# Patient Record
Sex: Male | Born: 1960 | Race: White | Hispanic: No | Marital: Single | Smoking: Never smoker
Health system: Southern US, Community
[De-identification: ages and names within clinical notes are randomized; demographics above are authoritative.]

## PROBLEM LIST (undated history)

## (undated) DIAGNOSIS — I83003 Varicose veins of unspecified lower extremity with ulcer of ankle: Secondary | ICD-10-CM

## (undated) DIAGNOSIS — E119 Type 2 diabetes mellitus without complications: Secondary | ICD-10-CM

## (undated) DIAGNOSIS — I878 Other specified disorders of veins: Secondary | ICD-10-CM

## (undated) DIAGNOSIS — L97309 Non-pressure chronic ulcer of unspecified ankle with unspecified severity: Secondary | ICD-10-CM

## (undated) HISTORY — PX: CHOLECYSTECTOMY: SHX55

---

## 2013-12-11 ENCOUNTER — Encounter (HOSPITAL_COMMUNITY): Payer: Self-pay | Admitting: Emergency Medicine

## 2013-12-11 ENCOUNTER — Emergency Department (HOSPITAL_COMMUNITY): Payer: Medicaid - Out of State

## 2013-12-11 ENCOUNTER — Emergency Department (HOSPITAL_COMMUNITY)
Admission: EM | Admit: 2013-12-11 | Discharge: 2013-12-12 | Disposition: A | Discharge status: Home / Self Care | Payer: Medicaid - Out of State | Attending: Emergency Medicine | Admitting: Emergency Medicine

## 2013-12-11 DIAGNOSIS — L97929 Non-pressure chronic ulcer of unspecified part of left lower leg with unspecified severity: Secondary | ICD-10-CM

## 2013-12-11 DIAGNOSIS — I83029 Varicose veins of left lower extremity with ulcer of unspecified site: Secondary | ICD-10-CM

## 2013-12-11 DIAGNOSIS — R110 Nausea: Secondary | ICD-10-CM | POA: Insufficient documentation

## 2013-12-11 DIAGNOSIS — E119 Type 2 diabetes mellitus without complications: Secondary | ICD-10-CM | POA: Insufficient documentation

## 2013-12-11 DIAGNOSIS — Z792 Long term (current) use of antibiotics: Secondary | ICD-10-CM | POA: Insufficient documentation

## 2013-12-11 DIAGNOSIS — L97909 Non-pressure chronic ulcer of unspecified part of unspecified lower leg with unspecified severity: Principal | ICD-10-CM | POA: Insufficient documentation

## 2013-12-11 DIAGNOSIS — R21 Rash and other nonspecific skin eruption: Secondary | ICD-10-CM | POA: Insufficient documentation

## 2013-12-11 DIAGNOSIS — I83009 Varicose veins of unspecified lower extremity with ulcer of unspecified site: Secondary | ICD-10-CM | POA: Insufficient documentation

## 2013-12-11 HISTORY — DX: Non-pressure chronic ulcer of unspecified ankle with unspecified severity: L97.309

## 2013-12-11 HISTORY — DX: Other specified disorders of veins: I87.8

## 2013-12-11 HISTORY — DX: Varicose veins of unspecified lower extremity with ulcer of ankle: I83.003

## 2013-12-11 HISTORY — DX: Type 2 diabetes mellitus without complications: E11.9

## 2013-12-11 LAB — COMPREHENSIVE METABOLIC PANEL
ALBUMIN: 3.6 g/dL (ref 3.5–5.2)
ALK PHOS: 66 U/L (ref 39–117)
ALT: 23 U/L (ref 0–53)
AST: 28 U/L (ref 0–37)
BILIRUBIN TOTAL: 0.5 mg/dL (ref 0.3–1.2)
BUN: 22 mg/dL (ref 6–23)
CHLORIDE: 105 meq/L (ref 96–112)
CO2: 24 meq/L (ref 19–32)
Calcium: 8.6 mg/dL (ref 8.4–10.5)
Creatinine, Ser: 1.2 mg/dL (ref 0.50–1.35)
GFR calc Af Amer: 79 mL/min — ABNORMAL LOW (ref >90)
GFR, EST NON AFRICAN AMERICAN: 68 mL/min — AB (ref >90)
GLUCOSE: 131 mg/dL — AB (ref 70–99)
POTASSIUM: 3.7 meq/L (ref 3.7–5.3)
Sodium: 145 mEq/L (ref 137–147)
Total Protein: 7.6 g/dL (ref 6.0–8.3)

## 2013-12-11 LAB — CBC WITH DIFFERENTIAL/PLATELET
Basophils Absolute: 0 10*3/uL (ref 0.0–0.1)
Basophils Relative: 1 % (ref 0–1)
Eosinophils Absolute: 0.2 10*3/uL (ref 0.0–0.7)
Eosinophils Relative: 3 % (ref 0–5)
HEMATOCRIT: 38.8 % — AB (ref 39.0–52.0)
HEMOGLOBIN: 13.1 g/dL (ref 13.0–17.0)
LYMPHS ABS: 1.4 10*3/uL (ref 0.7–4.0)
LYMPHS PCT: 22 % (ref 12–46)
MCH: 30.3 pg (ref 26.0–34.0)
MCHC: 33.8 g/dL (ref 30.0–36.0)
MCV: 89.8 fL (ref 78.0–100.0)
MONO ABS: 0.7 10*3/uL (ref 0.1–1.0)
MONOS PCT: 11 % (ref 3–12)
NEUTROS ABS: 3.9 10*3/uL (ref 1.7–7.7)
NEUTROS PCT: 63 % (ref 43–77)
Platelets: 213 10*3/uL (ref 150–400)
RBC: 4.32 MIL/uL (ref 4.22–5.81)
RDW: 14 % (ref 11.5–15.5)
WBC: 6.2 10*3/uL (ref 4.0–10.5)

## 2013-12-11 LAB — I-STAT CG4 LACTIC ACID, ED: LACTIC ACID, VENOUS: 1.1 mmol/L (ref 0.5–2.2)

## 2013-12-11 NOTE — ED Notes (Signed)
Pt reports venous stasis ulcers to left ankle. Pt reports having these ulcers for six years. Pt presents with three ulcers, draining, red and painful. Pt denies fevers, chills, v/d at home. Reports some nausea.

## 2013-12-11 NOTE — ED Notes (Signed)
Pt has three ulcers on his left lower leg that he has been dealing with for 6 years.  Pt states they had been much better when he was being [place], but since moving to [place] they have gotten worse.

## 2013-12-12 LAB — SEDIMENTATION RATE: Sed Rate: 33 mm/hr — ABNORMAL HIGH (ref 0–16)

## 2013-12-12 MED ORDER — FUROSEMIDE 10 MG/ML IJ SOLN
40.0000 mg | Freq: Once | INTRAMUSCULAR | Status: AC
  Administered 2013-12-12: 40 mg via INTRAVENOUS
  Filled 2013-12-12: qty 4

## 2013-12-12 MED ORDER — SULFAMETHOXAZOLE-TRIMETHOPRIM 800-160 MG PO TABS: 1.0000 | ORAL_TABLET | Freq: Two times a day (BID) | ORAL | Status: DC

## 2013-12-12 MED ORDER — HYDROMORPHONE HCL PF 1 MG/ML IJ SOLN
1.0000 mg | Freq: Once | INTRAMUSCULAR | Status: AC
  Administered 2013-12-12: 1 mg via INTRAVENOUS
  Filled 2013-12-12: qty 1

## 2013-12-12 MED ORDER — CEPHALEXIN 500 MG PO CAPS: 500.0000 mg | ORAL_CAPSULE | Freq: Three times a day (TID) | ORAL | Status: DC

## 2013-12-12 MED ORDER — HYDROCODONE-ACETAMINOPHEN 5-325 MG PO TABS: 2.0000 | ORAL_TABLET | ORAL | Status: DC | PRN

## 2013-12-12 MED ORDER — KETOROLAC TROMETHAMINE 15 MG/ML IJ SOLN: 15.0000 mg | Freq: Once | INTRAMUSCULAR | Status: DC

## 2013-12-12 NOTE — ED Provider Notes (Signed)
CSN: [id number]     Arrival date & time 12/11/13  2113 History   First MD Initiated Contact with Patient 12/11/13 2331     Chief Complaint  Patient presents with  . Venous Stasis Ulcer     (Consider location/radiation/quality/duration/timing/severity/associated sxs/prior Treatment) HPI Comments: 53 year old male with history of venous stasis, venous ulcer, diabetes presents with worsening pain and enlargement of left lower leg venous ulcer. Patient has had for 6 years however worse recently. Patient new to the area recently. Patient currently working on primary Dr for. Pain with palpation walking. Notion surgeries or blood clot history. No fevers or chills. Clear drainage and mild redness at the site. however mild nausea. No current antibiotics.   The history is provided by the patient.    Past Medical History  Diagnosis Date  . Venous stasis   . Venous stasis ulcer of ankle   . Diabetes mellitus without complication    Past Surgical History  Procedure Laterality Date  . Cholecystectomy     History reviewed. No pertinent family history. History  Substance Use Topics  . Smoking status: Never Smoker   . Smokeless tobacco: Never Used  . Alcohol Use: Yes     Comment: occassionally     Review of Systems  Constitutional: Negative for fever and chills.  HENT: Negative for congestion.   Respiratory: Negative for shortness of breath.   Cardiovascular: Negative for chest pain.  Gastrointestinal: Negative for vomiting and abdominal pain.  Genitourinary: Negative for flank pain.  Musculoskeletal: Negative for back pain, neck pain and neck stiffness.  Skin: Positive for rash and wound.  Neurological: Negative for light-headedness and headaches.      Allergies  Review of patient's allergies indicates no known allergies.  Home Medications   Prior to Admission medications   Medication Sig Start Date End Date Taking? Authorizing Provider  acetaminophen (TYLENOL) 325 MG tablet  Take 975 mg by mouth 2 (two) times daily as needed (pain).   Yes Historical Provider, MD  ibuprofen (ADVIL,MOTRIN) 200 MG tablet Take 1,200-1,400 mg by mouth 3 (three) times daily as needed (pain).   Yes Historical Provider, MD  cephALEXin (KEFLEX) 500 MG capsule Take 1 capsule (500 mg total) by mouth 3 (three) times daily. 12/12/13   Enid Skeens, MD  HYDROcodone-acetaminophen (NORCO) 5-325 MG per tablet Take 2 tablets by mouth every 4 (four) hours as needed. 12/12/13   Enid Skeens, MD  sulfamethoxazole-trimethoprim (SEPTRA DS) 800-160 MG per tablet Take 1 tablet by mouth 2 (two) times daily. 12/12/13   Enid Skeens, MD   BP 132/81  Pulse 76  Temp(Src) 98.9 F (37.2 C) (Oral)  Resp 18  Ht 5\' 8"  (1.727 m)  Wt 218 lb (98.884 kg)  BMI 33.15 kg/m2  SpO2 98% Physical Exam  Nursing note and vitals reviewed. Constitutional: He is oriented to person, place, and time. He appears well-developed and well-nourished.  HENT:  Head: Normocephalic and atraumatic.  Eyes: Conjunctivae are normal. Right eye exhibits no discharge. Left eye exhibits no discharge.  Neck: Normal range of motion. Neck supple. No tracheal deviation present.  Cardiovascular: Normal rate and regular rhythm.   Pulmonary/Chest: Effort normal and breath sounds normal.  Abdominal: Soft. He exhibits no distension. There is no tenderness. There is no guarding.  Musculoskeletal: He exhibits tenderness. He exhibits no edema.  Neurological: He is alert and oriented to person, place, and time.  Skin: Skin is warm. No rash noted.  Mass patient has large approximately 5  cm diameter ulceration left lateral lower leg and smaller ulcer distal with no active discharge, mild pain to palpation. No significant shunting erythema or streaking. No bone visualized or palpated. Neurovascular intact distal. No crepitus or bowl eye.  Psychiatric: He has a normal mood and affect.    ED Course  Procedures (including critical care time) Labs  Review Labs Reviewed  CBC WITH DIFFERENTIAL - Abnormal; Notable for the following:    HCT 38.8 (*)    All other components within normal limits  COMPREHENSIVE METABOLIC PANEL - Abnormal; Notable for the following:    Glucose, Bld 131 (*)    GFR calc non Af Amer 68 (*)    GFR calc Af Amer 79 (*)    All other components within normal limits  SEDIMENTATION RATE  I-STAT CG4 LACTIC ACID, ED    Imaging Review Dg Ankle Complete Left  12/11/2013   CLINICAL DATA:  Ulcers on the side of the leg.  EXAM: LEFT ANKLE COMPLETE - 3+ VIEW  COMPARISON:  No priors.  FINDINGS: Large soft tissue defect in the lateral aspect of the leg overlying the distal third of the fibular diaphysis. No underlying aggressive bony lesion. No acute displaced fracture, subluxation or dislocation. Mild degenerative changes of osteoarthritis are noted in the hindfoot and at the tibiotalar joint.  IMPRESSION: 1. No acute bony abnormality. 2. Large soft tissue defect in the lateral aspect of the left leg.   Electronically Signed   By: Trudie Reedaniel  Entrikin M.D.   On: 12/11/2013 23:50     EKG Interpretation None      MDM   Final diagnoses:  Venous ulcer of left leg  Nausea   Concern for worsening venous ulceration with possible secondary infection. Blood work and vitals unremarkable. Sedimentation rate sent help with outpatient followup as patient likely will need MRI outpatient. Pain meds given in ED and patient well-appearing on exam. X-ray no acute findings. Plan for oral pain meds and oral antibiotics. Patient has a followup appointment next week.  Results and differential diagnosis were discussed with the patient/parent/guardian. Close follow up outpatient was discussed, comfortable with the plan.   Filed Vitals:   12/11/13 2122 12/12/13 0109 12/12/13 0111 12/12/13 0115  BP: 117/77 131/83  132/81  Pulse: 93  80 76  Temp: 98.9 F (37.2 C)     TempSrc: Oral     Resp: 18     Height: 5\' 8"  (1.727 m)     Weight: 218  lb (98.884 kg)     SpO2: 96%  98% 98%         Enid SkeensJoshua M Eland Lamantia, MD 12/12/13 0150

## 2013-12-12 NOTE — Discharge Instructions (Signed)
If you were given medicines take as directed.  If you are on coumadin or contraceptives realize their levels and effectiveness is altered by many different medicines.  If you have any reaction (rash, tongues swelling, other) to the medicines stop taking and see a physician.   Please follow up as directed and return to the ER or see a physician for new or worsening symptoms fever, spreading redness, pus draining.  Thank you. Filed Vitals:   12/11/13 2122 12/12/13 0109 12/12/13 0111 12/12/13 0115  BP: 117/77 131/83  132/81  Pulse: 93  80 76  Temp: 98.9 F (37.2 C)     TempSrc: Oral     Resp: 18     Height: 5\' 8"  (1.727 m)     Weight: 218 lb (98.884 kg)     SpO2: 96%  98% 98%

## 2013-12-16 DIAGNOSIS — I872 Venous insufficiency (chronic) (peripheral): Secondary | ICD-10-CM | POA: Insufficient documentation

## 2013-12-16 DIAGNOSIS — Z79899 Other long term (current) drug therapy: Secondary | ICD-10-CM | POA: Insufficient documentation

## 2013-12-16 DIAGNOSIS — Z792 Long term (current) use of antibiotics: Secondary | ICD-10-CM | POA: Insufficient documentation

## 2013-12-16 DIAGNOSIS — E119 Type 2 diabetes mellitus without complications: Secondary | ICD-10-CM | POA: Insufficient documentation

## 2013-12-17 ENCOUNTER — Encounter (HOSPITAL_COMMUNITY): Payer: Self-pay | Admitting: Emergency Medicine

## 2013-12-17 ENCOUNTER — Emergency Department (HOSPITAL_COMMUNITY)
Admission: EM | Admit: 2013-12-17 | Discharge: 2013-12-17 | Disposition: A | Discharge status: Home / Self Care | Payer: Medicaid - Out of State | Attending: Emergency Medicine | Admitting: Emergency Medicine

## 2013-12-17 DIAGNOSIS — L97909 Non-pressure chronic ulcer of unspecified part of unspecified lower leg with unspecified severity: Secondary | ICD-10-CM

## 2013-12-17 DIAGNOSIS — I83009 Varicose veins of unspecified lower extremity with ulcer of unspecified site: Secondary | ICD-10-CM

## 2013-12-17 LAB — BASIC METABOLIC PANEL
BUN: 23 mg/dL (ref 6–23)
CALCIUM: 9.4 mg/dL (ref 8.4–10.5)
CO2: 23 mEq/L (ref 19–32)
Chloride: 102 mEq/L (ref 96–112)
Creatinine, Ser: 1.61 mg/dL — ABNORMAL HIGH (ref 0.50–1.35)
GFR calc Af Amer: 55 mL/min — ABNORMAL LOW (ref >90)
GFR calc non Af Amer: 48 mL/min — ABNORMAL LOW (ref >90)
GLUCOSE: 95 mg/dL (ref 70–99)
Potassium: 4.3 mEq/L (ref 3.7–5.3)
SODIUM: 140 meq/L (ref 137–147)

## 2013-12-17 LAB — CBC
HCT: 41.7 % (ref 39.0–52.0)
HEMOGLOBIN: 14.1 g/dL (ref 13.0–17.0)
MCH: 30.3 pg (ref 26.0–34.0)
MCHC: 33.8 g/dL (ref 30.0–36.0)
MCV: 89.7 fL (ref 78.0–100.0)
Platelets: 279 10*3/uL (ref 150–400)
RBC: 4.65 MIL/uL (ref 4.22–5.81)
RDW: 13.7 % (ref 11.5–15.5)
WBC: 10.5 10*3/uL (ref 4.0–10.5)

## 2013-12-17 MED ORDER — OXYCODONE-ACETAMINOPHEN 5-325 MG PO TABS
1.0000 | ORAL_TABLET | Freq: Once | ORAL | Status: DC
  Filled 2013-12-17: qty 1

## 2013-12-17 MED ORDER — HYDROMORPHONE HCL PF 1 MG/ML IJ SOLN
2.0000 mg | Freq: Once | INTRAMUSCULAR | Status: AC
  Administered 2013-12-17: 2 mg via INTRAMUSCULAR
  Filled 2013-12-17: qty 2

## 2013-12-17 MED ORDER — OXYCODONE-ACETAMINOPHEN 5-325 MG PO TABS: 1.0000 | ORAL_TABLET | Freq: Four times a day (QID) | ORAL | Status: DC | PRN

## 2013-12-17 MED ORDER — OXYCODONE-ACETAMINOPHEN 5-325 MG PO TABS
1.0000 | ORAL_TABLET | Freq: Once | ORAL | Status: AC
  Administered 2013-12-17: 1 via ORAL
  Filled 2013-12-17: qty 1

## 2013-12-17 NOTE — ED Notes (Signed)
Pt here for evaluation of pain due to swelling of LLE, three venous stasis ulcers present. No active drainage.

## 2013-12-17 NOTE — ED Provider Notes (Signed)
CSN: [id number]     Arrival date & time 12/16/13  2326 History   First MD Initiated Contact with Patient 12/17/13 [id number]     Chief Complaint  Patient presents with  . Venous Insufficiency     (Consider location/radiation/quality/duration/timing/severity/associated sxs/prior Treatment) HPI This is a 53 year old male with a history of chronic venous insufficiency in his left lower extremity. He recently moved here from [place] and has not established with a primary care physician. Over the past 2 months he has developed venous stasis ulcers of his left lower leg. He is here with complaints of severe pain associated with these ulcers. He was seen on the 22nd of this month and placed on Keflex and Bactrim for treatment of presumed infection. He was given 10 hydrocodone tablets which she is now out of. He has an appointment with a new PCP a week from tomorrow. He is requesting pain control in the meantime. He denies fevers. His wounds are not draining. Pain is worse with ambulation.  Past Medical History  Diagnosis Date  . Venous stasis   . Venous stasis ulcer of ankle   . Diabetes mellitus without complication    Past Surgical History  Procedure Laterality Date  . Cholecystectomy     No family history on file. History  Substance Use Topics  . Smoking status: Never Smoker   . Smokeless tobacco: Never Used  . Alcohol Use: Yes     Comment: occassionally     Review of Systems  All other systems reviewed and are negative.  Allergies  Review of patient's allergies indicates no known allergies.  Home Medications   Prior to Admission medications   Medication Sig Start Date End Date Taking? Authorizing Provider  acetaminophen (TYLENOL) 325 MG tablet Take 975 mg by mouth 2 (two) times daily as needed (pain).   Yes Historical Provider, MD  cephALEXin (KEFLEX) 500 MG capsule Take 1 capsule (500 mg total) by mouth 3 (three) times daily. 12/12/13  Yes Enid SkeensJoshua M Zavitz, MD  ibuprofen  (ADVIL,MOTRIN) 200 MG tablet Take 1,200-1,400 mg by mouth 3 (three) times daily as needed (pain).   Yes Historical Provider, MD  sulfamethoxazole-trimethoprim (SEPTRA DS) 800-160 MG per tablet Take 1 tablet by mouth 2 (two) times daily. 12/12/13  Yes Enid SkeensJoshua M Zavitz, MD   BP 117/88  Pulse 79  Temp(Src) 98.7 F (37.1 C) (Oral)  SpO2 96%  Physical Exam  General: Well-developed, well-nourished male in no acute distress; appearance consistent with age of record HENT: normocephalic; atraumatic Eyes: pupils equal, round and reactive to light; extraocular muscles intact Neck: supple Heart: regular rate and rhythm Lungs: clear to auscultation bilaterally Abdomen: soft; nondistended Extremities: No deformity; full range of motion; pulses normal; varicose veins of left lower leg with stasis ulcers as shown:     Skin (continued): No surrounding warmth or drainage noted about the ulcers Neurologic: Awake, alert and oriented; motor function intact in all extremities and symmetric; no facial droop Skin: Warm and dry Psychiatric: Normal mood and affect    ED Course  Procedures (including critical care time)  MDM   Nursing notes and vitals signs, including pulse oximetry, reviewed.  Summary of this visit's results, reviewed by myself:  Labs:  Results for orders placed during the hospital encounter of 12/17/13 (from the past 24 hour(s))  CBC     Status: None   Collection Time    12/17/13 12:18 AM      Result Value Ref Range   WBC 10.5  4.0 - 10.5 K/uL   RBC 4.65  4.22 - 5.81 MIL/uL   Hemoglobin 14.1  13.0 - 17.0 g/dL   HCT 16.6  06.0 - 04.5 %   MCV 89.7  78.0 - 100.0 fL   MCH 30.3  26.0 - 34.0 pg   MCHC 33.8  30.0 - 36.0 g/dL   RDW 99.7  74.1 - 42.3 %   Platelets 279  150 - 400 K/uL  BASIC METABOLIC PANEL     Status: Abnormal   Collection Time    12/17/13 12:18 AM      Result Value Ref Range   Sodium 140  137 - 147 mEq/L   Potassium 4.3  3.7 - 5.3 mEq/L   Chloride 102  96 -  112 mEq/L   CO2 23  19 - 32 mEq/L   Glucose, Bld 95  70 - 99 mg/dL   BUN 23  6 - 23 mg/dL   Creatinine, Ser 9.53 (*) 0.50 - 1.35 mg/dL   Calcium 9.4  8.4 - 20.2 mg/dL   GFR calc non Af Amer 48 (*) >90 mL/min   GFR calc Af Amer 55 (*) >90 mL/min        Hanley Seamen, MD 12/17/13 0345

## 2013-12-17 NOTE — Discharge Instructions (Signed)
Stasis Ulcer Stasis ulcers occur in the legs when the circulation is damaged. An ulcer may look like a small hole in the skin.  CAUSES Stasis ulcers occur because your veins do not work properly. Veins have valves that help the blood return to the heart. If these valves do not work right, blood flows backwards and backs up into the veins near the skin. This condition causes the veins to become larger because of increased pressure and may lead to a stasis ulcer. SYMPTOMS   Shallow (superficial) sore on the leg.  Clear drainage or weeping from the sore.  Leg pain or a feeling of heaviness. This may be worse at the end of the day.  Leg swelling.  Skin color changes. DIAGNOSIS  Your caregiver will make a diagnosis by examining your leg. Your caregiver may order tests such as an ultrasound or other studies to evaluate the blood flow of the leg. HOME CARE INSTRUCTIONS   Do not stand or sit in one position for long periods of time. Do not sit with your legs crossed. Rest with your legs raised during the day. If possible, it is best if you can elevate your legs above your heart for 30 minutes, 3 to 4 times a day.  Wear elastic stockings or support hose. Do not wear other tight encircling garments around legs, pelvis, or waist. This causes increased pressure in your veins. If your caregiver has applied compressive medicated wraps, use them as instructed.  Walk as much as possible to increase blood flow. If you are taking long rides in a car or plane, take a break to walk around every 2 hours. If not already on aspirin, take a baby aspirin before long trips unless you have medical reasons that prohibit this.  Raise the foot of your bed at night with 2-inch blocks if approved by your caregiver. This may not be desirable if you have heart failure or breathing problems.  If you get a cut in the skin over the vein and the vein bleeds, lie down with your leg raised and gently clean the area with a clean  cloth. Apply pressure on the cut until the bleeding stops. Then place a dressing on the cut. See your caregiver if it continues to bleed or needs stitches. Also, see your caregiver if you develop an infection.Signs of an infection include a fever, redness, increased pain, and drainage of pus.  If your caregiver has given you a follow-up appointment, it is very important to keep that appointment. Not keeping the appointment could result in a chronic or permanent injury, pain, and disability. If there is any problem keeping the appointment, call your caregiver for assistance. SEEK IMMEDIATE MEDICAL CARE IF:  You have pain, redness, tenderness, pus, or hard swelling in your leg over a vein or near the ulcer.  You develop an unexplained fever.  You develop chest pain or shortness of breath.  Document Released: 04/04/2001 Document Revised: 10/02/2011 Document Reviewed: 10/30/2010 [facility] Patient Information 2014 [place], [place].

## 2013-12-17 NOTE — ED Notes (Signed)
Pt reports hx of venous stasis of L leg. Pt has 3 ulcers present to L lower leg. Reports they were getting better but over past 2 months they have gotten larger again. Pt has not established a doctor in [place] yet. PMS intact distally.

## 2013-12-25 ENCOUNTER — Ambulatory Visit: Payer: Medicaid Other | Attending: Internal Medicine | Admitting: Internal Medicine

## 2013-12-25 ENCOUNTER — Encounter: Payer: Self-pay | Admitting: Internal Medicine

## 2013-12-25 VITALS — BP 130/74 | HR 78 | Temp 98.8°F | Resp 16 | Wt 217.2 lb

## 2013-12-25 DIAGNOSIS — D6859 Other primary thrombophilia: Secondary | ICD-10-CM | POA: Diagnosis not present

## 2013-12-25 DIAGNOSIS — E119 Type 2 diabetes mellitus without complications: Secondary | ICD-10-CM | POA: Insufficient documentation

## 2013-12-25 DIAGNOSIS — Z86718 Personal history of other venous thrombosis and embolism: Secondary | ICD-10-CM | POA: Insufficient documentation

## 2013-12-25 DIAGNOSIS — I749 Embolism and thrombosis of unspecified artery: Secondary | ICD-10-CM

## 2013-12-25 DIAGNOSIS — L97909 Non-pressure chronic ulcer of unspecified part of unspecified lower leg with unspecified severity: Secondary | ICD-10-CM

## 2013-12-25 DIAGNOSIS — I872 Venous insufficiency (chronic) (peripheral): Secondary | ICD-10-CM | POA: Insufficient documentation

## 2013-12-25 DIAGNOSIS — I83009 Varicose veins of unspecified lower extremity with ulcer of unspecified site: Secondary | ICD-10-CM

## 2013-12-25 DIAGNOSIS — L97809 Non-pressure chronic ulcer of other part of unspecified lower leg with unspecified severity: Secondary | ICD-10-CM | POA: Diagnosis not present

## 2013-12-25 DIAGNOSIS — I8290 Acute embolism and thrombosis of unspecified vein: Secondary | ICD-10-CM | POA: Insufficient documentation

## 2013-12-25 DIAGNOSIS — L97929 Non-pressure chronic ulcer of unspecified part of left lower leg with unspecified severity: Secondary | ICD-10-CM

## 2013-12-25 DIAGNOSIS — Z7901 Long term (current) use of anticoagulants: Secondary | ICD-10-CM | POA: Diagnosis not present

## 2013-12-25 DIAGNOSIS — I8310 Varicose veins of unspecified lower extremity with inflammation: Secondary | ICD-10-CM

## 2013-12-25 DIAGNOSIS — I83029 Varicose veins of left lower extremity with ulcer of unspecified site: Secondary | ICD-10-CM

## 2013-12-25 LAB — POCT GLYCOSYLATED HEMOGLOBIN (HGB A1C): Hemoglobin A1C: 5.7

## 2013-12-25 LAB — GLUCOSE, POCT (MANUAL RESULT ENTRY): POC Glucose: 142 mg/dl — AB (ref 70–99)

## 2013-12-25 LAB — POCT INR: INR: 1.1

## 2013-12-25 MED ORDER — ACETAMINOPHEN-CODEINE #3 300-30 MG PO TABS: 1.0000 | ORAL_TABLET | ORAL | Status: DC | PRN

## 2013-12-25 MED ORDER — WARFARIN SODIUM 5 MG PO TABS: 5.0000 mg | ORAL_TABLET | Freq: Every day | ORAL | Status: AC

## 2013-12-25 MED ORDER — METFORMIN HCL 500 MG PO TABS: 500.0000 mg | ORAL_TABLET | Freq: Two times a day (BID) | ORAL | Status: AC

## 2013-12-25 MED ORDER — SULFAMETHOXAZOLE-TRIMETHOPRIM 800-160 MG PO TABS: 1.0000 | ORAL_TABLET | Freq: Two times a day (BID) | ORAL | Status: AC

## 2013-12-25 NOTE — Progress Notes (Signed)
Patient ID: Jesse Mccoy, male   DOB: 07-15-1961, 53 y.o.   MRN: [id number]   Jesse Servelbert Adamski, is a 53 y.o. male  [id number]  [id number]  DOB - 07-15-1961  CC:  Chief Complaint  Patient presents with  . Establish Care       HPI: Jesse Servelbert Filion is a 53 y.o. male here today to establish medical care. Patient is known to have chronic venous stasis, protein S deficiency, diabetes mellitus, and venous ulcer on the left leg. He recently relocated from [place] to [place], he has been out of medication for his diabetes for over 2 weeks, and his Coumadin for the same period of time. 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 His INR today is 1.1. There are 3 ulcers on the left leg, one temp is enlarging in size and weeping. He denies fever. He was recently seen at urgent care for the same and was prescribed antibiotics but has not been established with wound care. Patient has No headache, No chest pain, No abdominal pain - No Nausea, No new weakness tingling or numbness, No Cough - SOB.  No Known Allergies Past Medical History  Diagnosis Date  . Venous stasis   . Venous stasis ulcer of ankle   . Diabetes mellitus without complication    No current outpatient prescriptions on file prior to visit.   No current facility-administered medications on file prior to visit.   History reviewed. No pertinent family history. History   Social History  . Marital Status: Single    Spouse Name: N/A    Number of Children: N/A  . Years of Education: N/A   Occupational History  . Not on file.   Social History Main Topics  . Smoking status: Never Smoker   . Smokeless tobacco: Never Used  . Alcohol Use: Yes     Comment: occassionally   . Drug Use: No  . Sexual Activity: Not on file   Other Topics Concern  . Not on file   Social History Narrative  . No narrative on file    Review of  Systems: Constitutional: Negative for fever, chills, diaphoresis, activity change, appetite change and fatigue. HENT: Negative for ear pain, nosebleeds, congestion, facial swelling, rhinorrhea, neck pain, neck stiffness and ear discharge.  Eyes: Negative for pain, discharge, redness, itching and visual disturbance. Respiratory: Negative for cough, choking, chest tightness, shortness of breath, wheezing and stridor.  Cardiovascular: Negative for chest pain, palpitations and leg swelling. Gastrointestinal: Negative for abdominal distention. Genitourinary: Negative for dysuria, urgency, frequency, hematuria, flank pain, decreased urine volume, difficulty urinating and dyspareunia.  Musculoskeletal: Negative for back pain, joint swelling, arthralgia and gait problem. Neurological: Negative for dizziness, tremors, seizures, syncope, facial asymmetry, speech difficulty, weakness, light-headedness, numbness and headaches.  Hematological: Negative for adenopathy. Does not bruise/bleed easily. Psychiatric/Behavioral: Negative for hallucinations, behavioral problems, confusion, dysphoric mood, decreased concentration and agitation.    Objective:   Filed Vitals:   12/25/13 1411  BP: 130/74  Pulse: 78  Temp: 98.8 F (37.1 C)  Resp: 16    Physical Exam: Constitutional: Patient appears well-developed and well-nourished. No distress. Morbidly obese HENT: Normocephalic, atraumatic, External right and left ear normal. Oropharynx is clear and moist.  Eyes: Conjunctivae and EOM are normal. PERRLA, no scleral icterus. Neck: Normal Mccoy. Neck supple. No JVD. No tracheal deviation. No thyromegaly. CVS: RRR, S1/S2 +, no murmurs, no gallops, no carotid bruit.  Pulmonary: Effort  and breath sounds normal, no stridor, rhonchi, wheezes, rales.  Abdominal: Soft. BS +, no distension, tenderness, rebound or guarding.  Musculoskeletal: large about 5 cm diameter ulceration left lateral lower leg and smaller ulcer  distal with no active discharge, mild pain to palpation. No significant shunting erythema or streaking. No bone visualized or palpated. Neurovascular intact distal. No crepitus. Lymphadenopathy: No lymphadenopathy noted, cervical, inguinal or axillary Neuro: Alert. Normal reflexes, muscle tone coordination. No cranial nerve deficit. Skin: Skin is warm and dry. No rash noted. Not diaphoretic. No erythema. No pallor. Psychiatric: Normal mood and affect. Behavior, judgment, thought content normal.  Lab Results  Component Value Date   WBC 10.5 12/17/2013   HGB 14.1 12/17/2013   HCT 41.7 12/17/2013   MCV 89.7 12/17/2013   PLT 279 12/17/2013   Lab Results  Component Value Date   CREATININE 1.61* 12/17/2013   BUN 23 12/17/2013   NA 140 12/17/2013   K 4.3 12/17/2013   CL 102 12/17/2013   CO2 23 12/17/2013    Lab Results  Component Value Date   HGBA1C 5.7 12/25/2013   Lipid Panel  No results found for this basename: chol, trig, hdl, cholhdl, vldl, ldlcalc       Assessment and plan:   1. DM (diabetes mellitus) - Glucose (CBG) - HgB A1c is 5.7% today Prescribe - metFORMIN (GLUCOPHAGE) 500 MG tablet; Take 1 tablet (500 mg total) by mouth 2 (two) times daily with a meal.  Dispense: 180 tablet; Refill: 1  2. Clot  - INR - warfarin (COUMADIN) 5 MG tablet; Take 1 tablet (5 mg total) by mouth daily at 6 PM.  Dispense: 30 tablet; Refill: 3  3. Venous stasis dermatitis of left lower extremity  - Ambulatory referral to Vascular Surgery  4. Venous ulcer of left leg  - Ambulatory referral to Vascular Surgery  - AMB referral to wound care center  - sulfamethoxazole-trimethoprim (SEPTRA DS) 800-160 MG per tablet; Take 1 tablet by mouth 2 (two) times daily.  Dispense: 28 tablet; Refill: 0 - acetaminophen-codeine (TYLENOL #3) 300-30 MG per tablet; Take 1 tablet by mouth every 4 (four) hours as needed.  Dispense: 60 tablet; Refill: 0  5. Protein S deficiency  - warfarin (COUMADIN) 5 MG tablet;  Take 1 tablet (5 mg total) by mouth daily at 6 PM.  Dispense: 30 tablet; Refill: 3  Patient was extensively counseled about medication compliance  Return in about 1 week (around 01/01/2014), or if symptoms worsen or fail to improve, for INR Check.  The patient was given clear instructions to go to ER or return to medical center if symptoms don't improve, worsen or new problems develop. The patient verbalized understanding. The patient was told to call to get lab results if they haven't heard anything in the next week.     This note has been created with Education officer, environmental. Any transcriptional errors are unintentional.    Jeanann Lewandowsky, MD, MHA, [facility] [place], [place] [phone]   12/25/2013, 2:40 PM

## 2013-12-25 NOTE — Progress Notes (Signed)
Patient here to establish care Has a history of venus stasis to his left leg Has 3 visible open ulcers to his left  Will need to be referred to wound care

## 2013-12-25 NOTE — Patient Instructions (Signed)
Diabetes and Exercise Exercising regularly is important. It is not just about losing weight. It has many health benefits, such as:  Improving your overall fitness, flexibility, and endurance.  Increasing your bone density.  Helping with weight control.  Decreasing your body fat.  Increasing your muscle strength.  Reducing stress and tension.  Improving your overall health. People with diabetes who exercise gain additional benefits because exercise:  Reduces appetite.  Improves the body's use of blood sugar (glucose).  Helps lower or control blood glucose.  Decreases blood pressure.  Helps control blood lipids (such as cholesterol and triglycerides).  Improves the body's use of the hormone insulin by:  Increasing the body's insulin sensitivity.  Reducing the body's insulin needs.  Decreases the risk for heart disease because exercising:  Lowers cholesterol and triglycerides levels.  Increases the levels of good cholesterol (such as high-density lipoproteins [HDL]) in the body.  Lowers blood glucose levels. YOUR ACTIVITY PLAN  Choose an activity that you enjoy and set realistic goals. Your health care provider or diabetes educator can help you make an activity plan that works for you. You can break activities into 2 or 3 sessions throughout the day. Doing so is as good as one long session. Exercise ideas include:  Taking the dog for a walk.  Taking the stairs instead of the elevator.  Dancing to your favorite song.  Doing your favorite exercise with a friend. RECOMMENDATIONS FOR EXERCISING WITH TYPE 1 OR TYPE 2 DIABETES   Check your blood glucose before exercising. If blood glucose levels are greater than 240 mg/dL, check for urine ketones. Do not exercise if ketones are present.  Avoid injecting insulin into areas of the body that are going to be exercised. For example, avoid injecting insulin into:  The arms when playing tennis.  The legs when  jogging.  Keep a record of:  Food intake before and after you exercise.  Expected peak times of insulin action.  Blood glucose levels before and after you exercise.  The type and amount of exercise you have done.  Review your records with your health care provider. Your health care provider will help you to develop guidelines for adjusting food intake and insulin amounts before and after exercising.  If you take insulin or oral hypoglycemic agents, watch for signs and symptoms of hypoglycemia. They include:  Dizziness.  Shaking.  Sweating.  Chills.  Confusion.  Drink plenty of water while you exercise to prevent dehydration or heat stroke. Body water is lost during exercise and must be replaced.  Talk to your health care provider before starting an exercise program to make sure it is safe for you. Remember, almost any type of activity is better than none. Document Released: 09/30/2003 Document Revised: 03/12/2013 Document Reviewed: 12/17/2012 [facility] Patient Information 2014 [place], [place]. Stasis Ulcer  A stasis ulcer is a sore on the skin. It occurs in the legs when your blood flow is damaged.  HOME CARE  Do not stand or sit in one position for a long time. Do not sit with your legs crossed. Raise (elevate) your legs.  Wear elastic stockings (compression stockings). Do not wear tight clothing around the legs or waist area. Use and apply bandages (dressings) as told.  Walk as much as possible. If you take long car or plane rides, take a break to walk around every 2 hours.  Only take medicine as told by your doctor.  Raise the end of your bed with 2-inch blocks only if your  doctor says it is okay.  If you cut the skin on your leg, lie down and raise your leg. Gently clean the area with a clean cloth. Then, put pressure on the cut until the bleeding stops. Put a bandage on.  Keep all doctor visits. GET HELP RIGHT AWAY IF:  The ulcer area starts to break down.  You  have pain, redness, or tenderness in or around the ulcer.  You have yellowish-white fluid (pus) or hard puffiness (swelling) in or around the ulcer.  Your pain gets worse.  You get a fever.  You have chest pain or shortness of breath. MAKE SURE YOU:  Understand these instructions.  Will watch your condition.  Will get help right away if you are not doing well or get worse. Document Released: 08/17/2004 Document Revised: 11/04/2012 Document Reviewed: 11/08/2010 [facility] Patient Information 2014 [place], [place]. Venous Stasis or Chronic Venous Insufficiency Chronic venous insufficiency, also called venous stasis, is a condition that affects the veins in the legs. The condition prevents blood from being pumped through these veins effectively. Blood may no longer be pumped effectively from the legs back to the heart. This condition can range from mild to severe. With proper treatment, you should be able to continue with an active life. CAUSES  Chronic venous insufficiency occurs when the vein walls become stretched, weakened, or damaged or when valves within the vein are damaged. Some common causes of this include:  High blood pressure inside the veins (venous hypertension).  Increased blood pressure in the leg veins from long periods of sitting or standing.  A blood clot that blocks blood flow in a vein (deep vein thrombosis).  Inflammation of a superficial vein (phlebitis) that causes a blood clot to form. RISK FACTORS Various things can make you more likely to develop chronic venous insufficiency, including:  Family history of this condition.  Obesity.  Pregnancy.  Sedentary lifestyle.  Smoking.  Jobs requiring long periods of standing or sitting in one place.  Being a certain age. Women in their 38s and 42s and men in their 24s are more likely to develop this condition. SIGNS AND SYMPTOMS  Symptoms may include:   Varicose veins.  Skin breakdown or  ulcers.  Reddened or discolored skin on the leg.  Brown, smooth, tight, and painful skin just above the ankle, usually on the inside surface (lipodermatosclerosis).  Swelling. DIAGNOSIS  To diagnose this condition, your health care provider will take a medical history and do a physical exam. The following tests may be ordered to confirm the diagnosis:  Duplex ultrasound A procedure that produces a picture of a blood vessel and nearby organs and also provides information on blood flow through the blood vessel.  Plethysmography A procedure that tests blood flow.  A venogram, or venography A procedure used to look at the veins using X-ray and dye. TREATMENT The goals of treatment are to help you return to an active life and to minimize pain or disability. Treatment will depend on the severity of the condition. Medical procedures may be needed for severe cases. Treatment options may include:   Use of compression stockings. These can help with symptoms and lower the chances of the problem getting worse, but they do not cure the problem.  Sclerotherapy A procedure involving an injection of a material that "dissolves" the damaged veins. Other veins in the network of blood vessels take over the function of the damaged veins.  Surgery to remove the vein or cut off blood flow  through the vein (vein stripping or laser ablation surgery).  Surgery to repair a valve. HOME CARE INSTRUCTIONS   Wear compression stockings as directed by your health care provider.  Only take over-the-counter or prescription medicines for pain, discomfort, or fever as directed by your health care provider.  Follow up with your health care provider as directed. SEEK MEDICAL CARE IF:   You have redness, swelling, or increasing pain in the affected area.  You see a red streak or line that extends up or down from the affected area.  You have a breakdown or loss of skin in the affected area, even if the breakdown is  small.  You have an injury to the affected area. SEEK IMMEDIATE MEDICAL CARE IF:   You have an injury and open wound in the affected area.  Your pain is severe and does not improve with medicine.  You have sudden numbness or weakness in the foot or ankle below the affected area, or you have trouble moving your foot or ankle.  You have a fever or persistent symptoms for more than 2 3 days.  You have a fever and your symptoms suddenly get worse. MAKE SURE YOU:   Understand these instructions.  Will watch your condition.  Will get help right away if you are not doing well or get worse. Document Released: 11/13/2006 Document Revised: 04/30/2013 Document Reviewed: 03/17/2013 [facility] Patient Information 2014 [place], [place].

## 2014-01-02 ENCOUNTER — Encounter (HOSPITAL_COMMUNITY): Payer: Self-pay | Admitting: Emergency Medicine

## 2014-01-02 ENCOUNTER — Emergency Department (HOSPITAL_COMMUNITY)
Admission: EM | Admit: 2014-01-02 | Discharge: 2014-01-03 | Disposition: A | Discharge status: Home / Self Care | Payer: Medicaid Other | Attending: Emergency Medicine | Admitting: Emergency Medicine

## 2014-01-02 DIAGNOSIS — I872 Venous insufficiency (chronic) (peripheral): Secondary | ICD-10-CM | POA: Insufficient documentation

## 2014-01-02 DIAGNOSIS — M79605 Pain in left leg: Secondary | ICD-10-CM

## 2014-01-02 DIAGNOSIS — E119 Type 2 diabetes mellitus without complications: Secondary | ICD-10-CM | POA: Insufficient documentation

## 2014-01-02 DIAGNOSIS — L97909 Non-pressure chronic ulcer of unspecified part of unspecified lower leg with unspecified severity: Secondary | ICD-10-CM | POA: Insufficient documentation

## 2014-01-02 DIAGNOSIS — Z79899 Other long term (current) drug therapy: Secondary | ICD-10-CM | POA: Insufficient documentation

## 2014-01-02 DIAGNOSIS — L97929 Non-pressure chronic ulcer of unspecified part of left lower leg with unspecified severity: Secondary | ICD-10-CM

## 2014-01-02 DIAGNOSIS — Z7901 Long term (current) use of anticoagulants: Secondary | ICD-10-CM | POA: Insufficient documentation

## 2014-01-02 LAB — BASIC METABOLIC PANEL
BUN: 12 mg/dL (ref 6–23)
CO2: 27 mEq/L (ref 19–32)
CREATININE: 0.92 mg/dL (ref 0.50–1.35)
Calcium: 8.9 mg/dL (ref 8.4–10.5)
Chloride: 107 mEq/L (ref 96–112)
GFR calc Af Amer: >90 mL/min (ref >90)
GLUCOSE: 100 mg/dL — AB (ref 70–99)
Potassium: 4.2 mEq/L (ref 3.7–5.3)
Sodium: 146 mEq/L (ref 137–147)

## 2014-01-02 LAB — CBC
HEMATOCRIT: 40.7 % (ref 39.0–52.0)
HEMOGLOBIN: 13.2 g/dL (ref 13.0–17.0)
MCH: 29.3 pg (ref 26.0–34.0)
MCHC: 32.4 g/dL (ref 30.0–36.0)
MCV: 90.4 fL (ref 78.0–100.0)
Platelets: 244 10*3/uL (ref 150–400)
RBC: 4.5 MIL/uL (ref 4.22–5.81)
RDW: 14 % (ref 11.5–15.5)
WBC: 6.2 10*3/uL (ref 4.0–10.5)

## 2014-01-02 MED ORDER — OXYCODONE-ACETAMINOPHEN 5-325 MG PO TABS
1.0000 | ORAL_TABLET | Freq: Once | ORAL | Status: AC
  Administered 2014-01-02: 1 via ORAL
  Filled 2014-01-02: qty 1

## 2014-01-02 MED ORDER — HYDROCODONE-ACETAMINOPHEN 5-325 MG PO TABS
2.0000 | ORAL_TABLET | Freq: Once | ORAL | Status: AC
  Administered 2014-01-02: 2 via ORAL
  Filled 2014-01-02: qty 2

## 2014-01-02 MED ORDER — HYDROCODONE-ACETAMINOPHEN 5-325 MG PO TABS: 1.0000 | ORAL_TABLET | Freq: Four times a day (QID) | ORAL | Status: AC | PRN

## 2014-01-02 MED ORDER — ENOXAPARIN SODIUM 100 MG/ML ~~LOC~~ SOLN
1.0000 mg/kg | Freq: Once | SUBCUTANEOUS | Status: AC
  Administered 2014-01-03: 100 mg via SUBCUTANEOUS
  Filled 2014-01-02 (×2): qty 1

## 2014-01-02 NOTE — ED Notes (Signed)
Pt has chronic venous stasis ulcers to LLE for past 6 years. States the ulcers have gotten larger and more painful this week.

## 2014-01-02 NOTE — Discharge Instructions (Signed)
Skin Ulcer  A skin ulcer is an open sore that can be shallow or deep. Skin ulcers sometimes become infected and are difficult to treat. It may be 1 month or longer before real healing progress is made.  CAUSES    Injury.   Problems with the veins or arteries.   Diabetes.   Insect bites.   Bedsores.   Inflammatory conditions.  SYMPTOMS    Pain, redness, swelling, and tenderness around the ulcer.   Fever.   Bleeding from the ulcer.   Yellow or clear fluid coming from the ulcer.  DIAGNOSIS   There are many types of skin ulcers. Any open sores will be examined. Certain tests will be done to determine the kind of ulcer you have. The right treatment depends on the type of ulcer you have.  TREATMENT   Treatment is a long-term challenge. It may include:   Wearing an elastic wrap, compression stockings, or gel cast over the ulcer area.   Taking antibiotic medicines or putting antibiotic creams on the affected area if there is an infection.  HOME CARE INSTRUCTIONS   Put on your bandages (dressings), wraps, or casts over the ulcer as directed by your caregiver.   Change all dressings as directed by your caregiver.   Take all medicines as directed by your caregiver.   Keep the affected area clean and dry.   Avoid injuries to the affected area.   Eat a well-balanced, healthy diet that includes plenty of fruit and vegetables.   If you smoke, consider quitting or decreasing the amount of cigarettes you smoke.   Once the ulcer heals, get regular exercise as directed by your caregiver.   Work with your caregiver to make sure your blood pressure, cholesterol, and diabetes are well-controlled.   Keep your skin moisturized. Dry skin can crack and lead to skin ulcers.  SEEK IMMEDIATE MEDICAL CARE IF:    Your pain gets worse.   You have swelling, redness, or fluids around the ulcer.   You have chills.   You have a fever.  MAKE SURE YOU:    Understand these instructions.   Will watch your condition.   Will get  help right away if you are not doing well or get worse.  Document Released: 08/17/2004 Document Revised: 10/02/2011 Document Reviewed: 02/24/2011  [employer] Patient Information 2014 [employer].

## 2014-01-03 ENCOUNTER — Ambulatory Visit (HOSPITAL_COMMUNITY): Payer: Medicaid Other | Attending: Emergency Medicine

## 2014-01-03 NOTE — ED Provider Notes (Signed)
CSN: [id number]     Arrival date & time 01/02/14  1819 History   First MD Initiated Contact with Patient 01/02/14 2135     Chief Complaint  Patient presents with  . Leg Pain     (Consider location/radiation/quality/duration/timing/severity/associated sxs/prior Treatment) Patient is a 53 y.o. male presenting with leg pain. The history is provided by the patient.  Leg Pain Location:  Leg Time since incident: Years. Injury: no   Leg location:  L lower leg Pain details:    Quality:  Aching   Radiates to:  Does not radiate   Severity:  Moderate   Onset quality:  Gradual   Duration: 6 years.   Timing:  Constant   Progression:  Unchanged Chronicity:  Chronic Associated symptoms: no fever     Past Medical History  Diagnosis Date  . Venous stasis   . Venous stasis ulcer of ankle   . Diabetes mellitus without complication    Past Surgical History  Procedure Laterality Date  . Cholecystectomy     History reviewed. No pertinent family history. History  Substance Use Topics  . Smoking status: Never Smoker   . Smokeless tobacco: Never Used  . Alcohol Use: Yes     Comment: occassionally     Review of Systems  Constitutional: Negative for fever and chills.  Respiratory: Negative for cough and shortness of breath.   All other systems reviewed and are negative.     Allergies  Review of patient's allergies indicates no known allergies.  Home Medications   Prior to Admission medications   Medication Sig Start Date End Date Taking? Authorizing Provider  acetaminophen-codeine (TYLENOL #3) 300-30 MG per tablet Take 1 tablet by mouth every 4 (four) hours as needed. 12/25/13   Jeanann Lewandowskylugbemiga Jegede, MD  HYDROcodone-acetaminophen (NORCO/VICODIN) 5-325 MG per tablet Take 1 tablet by mouth every 6 (six) hours as needed for moderate pain. 01/02/14   Dagmar HaitWilliam Iysha Mishkin, MD  metFORMIN (GLUCOPHAGE) 500 MG tablet Take 1 tablet (500 mg total) by mouth 2 (two) times daily with a meal. 12/25/13    Jeanann Lewandowskylugbemiga Jegede, MD  sulfamethoxazole-trimethoprim (SEPTRA DS) 800-160 MG per tablet Take 1 tablet by mouth 2 (two) times daily. 12/25/13   Jeanann Lewandowskylugbemiga Jegede, MD  warfarin (COUMADIN) 5 MG tablet Take 1 tablet (5 mg total) by mouth daily at 6 PM. 12/25/13   Jeanann Lewandowskylugbemiga Jegede, MD   BP 96/53  Pulse 58  Temp(Src) 98.2 F (36.8 C) (Oral)  Resp 16  SpO2 99% Physical Exam  Nursing note and vitals reviewed. Constitutional: He is oriented to person, place, and time. He appears well-developed and well-nourished. No distress.  HENT:  Head: Normocephalic and atraumatic.  Mouth/Throat: No oropharyngeal exudate.  Eyes: EOM are normal. Pupils are equal, round, and reactive to light.  Neck: Normal range of motion. Neck supple.  Cardiovascular: Normal rate and regular rhythm.  Exam reveals no friction rub.   No murmur heard. Pulmonary/Chest: Effort normal and breath sounds normal. No respiratory distress. He has no wheezes. He has no rales.  Abdominal: Soft. He exhibits no distension. There is no tenderness. There is no rebound.  Musculoskeletal: Normal range of motion. He exhibits edema (L calf, non-pitting).  Multiple large skin ulcers to L lower calf, mild redness, but no cellulitis surrounding.  Neurological: He is alert and oriented to person, place, and time.  Skin: He is not diaphoretic.    ED Course  Procedures (including critical care time) Labs Review Labs Reviewed  BASIC METABOLIC PANEL -  Abnormal; Notable for the following:    Glucose, Bld 100 (*)    All other components within normal limits  CBC    Imaging Review No results found.   EKG Interpretation None      MDM   Final diagnoses:  Left leg pain  Ulcer of leg, chronic, left    53 year old male here with chronic venous stasis all her ulcers on his lower sugar nares. He is having pain in the left leg tonight. He has some redness around the ulcers which is chronic. He was elicited on Coumadin for protein S deficiency  and prior DVT, but is not on this medication because he is broke. He is also supposed to be on antibiotics (Bactrim) per Dr. Concepcion ElkAvbuere.  Here no signs of cellulitis. Left leg is swollen, but cannot obtain vascular ultrasound tonight. Given shot of Lovenox and had ultrasound scheduled for the morning. Stressed importance of getting his medications filled. Labs normal. No white count. No fevers or vomiting. He is stable for discharge.    Dagmar HaitWilliam Reza Crymes, MD 01/03/14 [id number]

## 2014-01-29 ENCOUNTER — Other Ambulatory Visit: Payer: Self-pay | Admitting: *Deleted

## 2014-01-29 DIAGNOSIS — I83029 Varicose veins of left lower extremity with ulcer of unspecified site: Secondary | ICD-10-CM

## 2014-01-29 DIAGNOSIS — L97929 Non-pressure chronic ulcer of unspecified part of left lower leg with unspecified severity: Principal | ICD-10-CM

## 2014-01-30 ENCOUNTER — Encounter (HOSPITAL_BASED_OUTPATIENT_CLINIC_OR_DEPARTMENT_OTHER): Payer: Medicaid Other

## 2014-01-30 ENCOUNTER — Ambulatory Visit (HOSPITAL_BASED_OUTPATIENT_CLINIC_OR_DEPARTMENT_OTHER): Payer: Medicaid Other

## 2014-01-30 ENCOUNTER — Inpatient Hospital Stay: Payer: Self-pay | Admitting: Internal Medicine

## 2014-01-30 LAB — CBC WITH DIFFERENTIAL/PLATELET
Basophil #: 0.1 10*3/uL (ref 0.0–0.1)
Basophil %: 0.7 %
Eosinophil #: 0.1 10*3/uL (ref 0.0–0.7)
Eosinophil %: 1.6 %
HCT: 38.8 % — ABNORMAL LOW (ref 40.0–52.0)
HGB: 12.7 g/dL — ABNORMAL LOW (ref 13.0–18.0)
LYMPHS ABS: 1.5 10*3/uL (ref 1.0–3.6)
Lymphocyte %: 18.9 %
MCH: 29.7 pg (ref 26.0–34.0)
MCHC: 32.7 g/dL (ref 32.0–36.0)
MCV: 91 fL (ref 80–100)
MONO ABS: 1.5 x10 3/mm — AB (ref 0.2–1.0)
MONOS PCT: 18.2 %
NEUTROS ABS: 4.8 10*3/uL (ref 1.4–6.5)
NEUTROS PCT: 60.6 %
PLATELETS: 245 10*3/uL (ref 150–440)
RBC: 4.28 10*6/uL — ABNORMAL LOW (ref 4.40–5.90)
RDW: 13.6 % (ref 11.5–14.5)
WBC: 8 10*3/uL (ref 3.8–10.6)

## 2014-01-30 LAB — BASIC METABOLIC PANEL
Anion Gap: 5 — ABNORMAL LOW (ref 7–16)
BUN: 17 mg/dL (ref 7–18)
CALCIUM: 8.4 mg/dL — AB (ref 8.5–10.1)
Chloride: 107 mmol/L (ref 98–107)
Co2: 28 mmol/L (ref 21–32)
Creatinine: 1.29 mg/dL (ref 0.60–1.30)
Glucose: 99 mg/dL (ref 65–99)
Osmolality: 281 (ref 275–301)
Potassium: 4.3 mmol/L (ref 3.5–5.1)
SODIUM: 140 mmol/L (ref 136–145)

## 2014-01-30 LAB — DRUG SCREEN, URINE
Amphetamines, Ur Screen: NEGATIVE (ref ?–1000)
BENZODIAZEPINE, UR SCRN: NEGATIVE (ref ?–200)
Barbiturates, Ur Screen: NEGATIVE (ref ?–200)
CANNABINOID 50 NG, UR ~~LOC~~: NEGATIVE (ref ?–50)
Cocaine Metabolite,Ur ~~LOC~~: POSITIVE (ref ?–300)
MDMA (ECSTASY) UR SCREEN: NEGATIVE (ref ?–500)
Methadone, Ur Screen: NEGATIVE (ref ?–300)
OPIATE, UR SCREEN: NEGATIVE (ref ?–300)
PHENCYCLIDINE (PCP) UR S: NEGATIVE (ref ?–25)
TRICYCLIC, UR SCREEN: NEGATIVE (ref ?–1000)

## 2014-01-30 LAB — PROTIME-INR
INR: 1
Prothrombin Time: 13.2 secs (ref 11.5–14.7)

## 2014-01-30 LAB — HEMOGLOBIN A1C: Hemoglobin A1C: 6.4 % — ABNORMAL HIGH (ref 4.2–6.3)

## 2014-01-30 LAB — SEDIMENTATION RATE: Erythrocyte Sed Rate: 1 mm/hr (ref 0–20)

## 2014-01-31 LAB — CBC WITH DIFFERENTIAL/PLATELET
BASOS ABS: 0 10*3/uL (ref 0.0–0.1)
BASOS PCT: 0.3 %
Eosinophil #: 0.1 10*3/uL (ref 0.0–0.7)
Eosinophil %: 1.3 %
HCT: 38.7 % — AB (ref 40.0–52.0)
HGB: 12.9 g/dL — ABNORMAL LOW (ref 13.0–18.0)
Lymphocyte #: 1 10*3/uL (ref 1.0–3.6)
Lymphocyte %: 12.6 %
MCH: 29.6 pg (ref 26.0–34.0)
MCHC: 33.3 g/dL (ref 32.0–36.0)
MCV: 89 fL (ref 80–100)
MONO ABS: 0.9 x10 3/mm (ref 0.2–1.0)
Monocyte %: 11.9 %
NEUTROS ABS: 5.8 10*3/uL (ref 1.4–6.5)
NEUTROS PCT: 73.9 %
PLATELETS: 233 10*3/uL (ref 150–440)
RBC: 4.35 10*6/uL — AB (ref 4.40–5.90)
RDW: 13.7 % (ref 11.5–14.5)
WBC: 7.8 10*3/uL (ref 3.8–10.6)

## 2014-01-31 LAB — BASIC METABOLIC PANEL
ANION GAP: 4 — AB (ref 7–16)
BUN: 11 mg/dL (ref 7–18)
CALCIUM: 8.2 mg/dL — AB (ref 8.5–10.1)
CHLORIDE: 106 mmol/L (ref 98–107)
CO2: 28 mmol/L (ref 21–32)
Creatinine: 1.2 mg/dL (ref 0.60–1.30)
EGFR (Non-African Amer.): >60
Glucose: 103 mg/dL — ABNORMAL HIGH (ref 65–99)
OSMOLALITY: 275 (ref 275–301)
Potassium: 3.5 mmol/L (ref 3.5–5.1)
Sodium: 138 mmol/L (ref 136–145)

## 2014-02-01 LAB — VANCOMYCIN, TROUGH: VANCOMYCIN, TROUGH: 10 ug/mL (ref 10–20)

## 2014-02-02 LAB — VANCOMYCIN, TROUGH: Vancomycin, Trough: 17 ug/mL (ref 10–20)

## 2014-02-03 LAB — CBC WITH DIFFERENTIAL/PLATELET
BASOS PCT: 0.9 %
Basophil #: 0.1 10*3/uL (ref 0.0–0.1)
EOS ABS: 0.3 10*3/uL (ref 0.0–0.7)
EOS PCT: 3.4 %
HCT: 38.1 % — AB (ref 40.0–52.0)
HGB: 12.4 g/dL — ABNORMAL LOW (ref 13.0–18.0)
LYMPHS ABS: 1.6 10*3/uL (ref 1.0–3.6)
LYMPHS PCT: 20.5 %
MCH: 28.9 pg (ref 26.0–34.0)
MCHC: 32.7 g/dL (ref 32.0–36.0)
MCV: 89 fL (ref 80–100)
MONO ABS: 0.9 x10 3/mm (ref 0.2–1.0)
Monocyte %: 12.1 %
NEUTROS ABS: 4.9 10*3/uL (ref 1.4–6.5)
Neutrophil %: 63.1 %
Platelet: 291 10*3/uL (ref 150–440)
RBC: 4.3 10*6/uL — ABNORMAL LOW (ref 4.40–5.90)
RDW: 13.2 % (ref 11.5–14.5)
WBC: 7.8 10*3/uL (ref 3.8–10.6)

## 2014-02-03 LAB — BASIC METABOLIC PANEL
ANION GAP: 6 — AB (ref 7–16)
BUN: 12 mg/dL (ref 7–18)
Calcium, Total: 8.4 mg/dL — ABNORMAL LOW (ref 8.5–10.1)
Chloride: 105 mmol/L (ref 98–107)
Co2: 27 mmol/L (ref 21–32)
Creatinine: 1.06 mg/dL (ref 0.60–1.30)
EGFR (African American): >60
GLUCOSE: 134 mg/dL — AB (ref 65–99)
Osmolality: 277 (ref 275–301)
POTASSIUM: 3.7 mmol/L (ref 3.5–5.1)
SODIUM: 138 mmol/L (ref 136–145)

## 2014-02-03 LAB — WOUND CULTURE

## 2014-02-10 ENCOUNTER — Encounter: Payer: Self-pay | Admitting: Vascular Surgery

## 2014-02-11 ENCOUNTER — Inpatient Hospital Stay (HOSPITAL_COMMUNITY): Admission: RE | Admit: 2014-02-11 | Payer: Medicaid Other | Source: Ambulatory Visit

## 2014-02-11 ENCOUNTER — Encounter: Payer: Medicaid Other | Admitting: Vascular Surgery

## 2014-02-11 ENCOUNTER — Encounter: Payer: Self-pay | Admitting: Surgery

## 2014-02-21 ENCOUNTER — Encounter: Payer: Self-pay | Admitting: Surgery

## 2014-03-24 ENCOUNTER — Encounter: Payer: Self-pay | Admitting: Surgery

## 2014-03-31 ENCOUNTER — Telehealth: Payer: Self-pay | Admitting: Internal Medicine

## 2014-03-31 ENCOUNTER — Encounter: Payer: Self-pay | Admitting: General Surgery

## 2014-03-31 NOTE — Telephone Encounter (Signed)
Pt. Called to request a refill on metFORMIN (GLUCOPHAGE) 500 MG tablet  And warfarin (COUMADIN) 5 MG tablet [[id number]]. Pt. States that he will be going to rehab for 2 months and needs a 2 month supply for his medications. Please f/u with pt. At [phone].

## 2014-04-01 LAB — WOUND CULTURE

## 2014-04-04 ENCOUNTER — Emergency Department: Payer: Self-pay | Admitting: Emergency Medicine

## 2014-04-04 LAB — URINALYSIS, COMPLETE
Bilirubin,UR: NEGATIVE
Blood: NEGATIVE
Ketone: NEGATIVE
LEUKOCYTE ESTERASE: NEGATIVE
NITRITE: NEGATIVE
Ph: 6 (ref 4.5–8.0)
Protein: 25
Specific Gravity: 1.025 (ref 1.003–1.030)
Squamous Epithelial: <1
WBC UR: 1 /HPF (ref 0–5)

## 2014-04-04 LAB — COMPREHENSIVE METABOLIC PANEL
ALK PHOS: 74 U/L
ALT: 38 U/L
ANION GAP: 9 (ref 7–16)
Albumin: 3.5 g/dL (ref 3.4–5.0)
BILIRUBIN TOTAL: 0.9 mg/dL (ref 0.2–1.0)
BUN: 18 mg/dL (ref 7–18)
CALCIUM: 8.5 mg/dL (ref 8.5–10.1)
CHLORIDE: 113 mmol/L — AB (ref 98–107)
CO2: 21 mmol/L (ref 21–32)
CREATININE: 1.44 mg/dL — AB (ref 0.60–1.30)
EGFR (African American): >60
GFR CALC NON AF AMER: 55 — AB
Glucose: 128 mg/dL — ABNORMAL HIGH (ref 65–99)
Osmolality: 289 (ref 275–301)
POTASSIUM: 3.4 mmol/L — AB (ref 3.5–5.1)
SGOT(AST): 21 U/L (ref 15–37)
Sodium: 143 mmol/L (ref 136–145)
Total Protein: 8 g/dL (ref 6.4–8.2)

## 2014-04-04 LAB — CBC
HCT: 41.8 % (ref 40.0–52.0)
HGB: 13.3 g/dL (ref 13.0–18.0)
MCH: 28.6 pg (ref 26.0–34.0)
MCHC: 31.9 g/dL — AB (ref 32.0–36.0)
MCV: 90 fL (ref 80–100)
Platelet: 241 10*3/uL (ref 150–440)
RBC: 4.66 10*6/uL (ref 4.40–5.90)
RDW: 15.1 % — AB (ref 11.5–14.5)
WBC: 7.7 10*3/uL (ref 3.8–10.6)

## 2014-04-04 LAB — DRUG SCREEN, URINE
Amphetamines, Ur Screen: NEGATIVE (ref ?–1000)
BARBITURATES, UR SCREEN: NEGATIVE (ref ?–200)
BENZODIAZEPINE, UR SCRN: NEGATIVE (ref ?–200)
CANNABINOID 50 NG, UR ~~LOC~~: NEGATIVE (ref ?–50)
COCAINE METABOLITE, UR ~~LOC~~: POSITIVE (ref ?–300)
MDMA (Ecstasy)Ur Screen: NEGATIVE (ref ?–500)
Methadone, Ur Screen: NEGATIVE (ref ?–300)
OPIATE, UR SCREEN: NEGATIVE (ref ?–300)
PHENCYCLIDINE (PCP) UR S: NEGATIVE (ref ?–25)
Tricyclic, Ur Screen: NEGATIVE (ref ?–1000)

## 2014-04-04 LAB — ACETAMINOPHEN LEVEL

## 2014-04-04 LAB — ETHANOL

## 2014-04-04 LAB — SALICYLATE LEVEL

## 2014-04-23 ENCOUNTER — Encounter: Payer: Self-pay | Admitting: General Surgery

## 2014-04-23 ENCOUNTER — Encounter: Payer: Self-pay | Admitting: Surgery

## 2014-04-26 ENCOUNTER — Emergency Department: Payer: Self-pay | Admitting: Emergency Medicine

## 2014-04-27 LAB — CBC
HCT: 42.2 % (ref 40.0–52.0)
HGB: 13.7 g/dL (ref 13.0–18.0)
MCH: 28.8 pg (ref 26.0–34.0)
MCHC: 32.5 g/dL (ref 32.0–36.0)
MCV: 89 fL (ref 80–100)
PLATELETS: 223 10*3/uL (ref 150–440)
RBC: 4.77 10*6/uL (ref 4.40–5.90)
RDW: 14.7 % — ABNORMAL HIGH (ref 11.5–14.5)
WBC: 7 10*3/uL (ref 3.8–10.6)

## 2014-04-27 LAB — BASIC METABOLIC PANEL
Anion Gap: 6 — ABNORMAL LOW (ref 7–16)
BUN: 10 mg/dL (ref 7–18)
CALCIUM: 8.7 mg/dL (ref 8.5–10.1)
Chloride: 107 mmol/L (ref 98–107)
Co2: 28 mmol/L (ref 21–32)
Creatinine: 0.97 mg/dL (ref 0.60–1.30)
Glucose: 92 mg/dL (ref 65–99)
OSMOLALITY: 280 (ref 275–301)
Potassium: 3.4 mmol/L — ABNORMAL LOW (ref 3.5–5.1)
Sodium: 141 mmol/L (ref 136–145)

## 2014-05-12 LAB — WOUND AEROBIC CULTURE

## 2014-05-14 ENCOUNTER — Telehealth: Payer: Self-pay | Admitting: Internal Medicine

## 2014-05-14 NOTE — Telephone Encounter (Signed)
Patient requests refill for Tylenol 3 ASAP. Please follow with Patient, because he prefers the prescription be sent to his [facility].

## 2014-05-15 ENCOUNTER — Inpatient Hospital Stay: Payer: Self-pay | Admitting: Surgery

## 2014-05-15 ENCOUNTER — Telehealth: Payer: Self-pay

## 2014-05-15 DIAGNOSIS — I83029 Varicose veins of left lower extremity with ulcer of unspecified site: Secondary | ICD-10-CM

## 2014-05-15 DIAGNOSIS — L97929 Non-pressure chronic ulcer of unspecified part of left lower leg with unspecified severity: Principal | ICD-10-CM

## 2014-05-15 LAB — BASIC METABOLIC PANEL
Anion Gap: 5 — ABNORMAL LOW (ref 7–16)
BUN: 15 mg/dL (ref 7–18)
CALCIUM: 8.1 mg/dL — AB (ref 8.5–10.1)
Chloride: 110 mmol/L — ABNORMAL HIGH (ref 98–107)
Co2: 28 mmol/L (ref 21–32)
Creatinine: 1.11 mg/dL (ref 0.60–1.30)
EGFR (Non-African Amer.): >60
GLUCOSE: 127 mg/dL — AB (ref 65–99)
Osmolality: 287 (ref 275–301)
Potassium: 3.5 mmol/L (ref 3.5–5.1)
Sodium: 143 mmol/L (ref 136–145)

## 2014-05-15 LAB — CBC WITH DIFFERENTIAL/PLATELET
BASOS ABS: 0.1 10*3/uL (ref 0.0–0.1)
BASOS PCT: 0.6 %
EOS ABS: 0.1 10*3/uL (ref 0.0–0.7)
Eosinophil %: 1.7 %
HCT: 36.8 % — ABNORMAL LOW (ref 40.0–52.0)
HGB: 11.5 g/dL — AB (ref 13.0–18.0)
LYMPHS ABS: 1.4 10*3/uL (ref 1.0–3.6)
LYMPHS PCT: 15.3 %
MCH: 27.7 pg (ref 26.0–34.0)
MCHC: 31.2 g/dL — AB (ref 32.0–36.0)
MCV: 89 fL (ref 80–100)
MONOS PCT: 10.1 %
Monocyte #: 0.9 x10 3/mm (ref 0.2–1.0)
Neutrophil #: 6.4 10*3/uL (ref 1.4–6.5)
Neutrophil %: 72.3 %
Platelet: 218 10*3/uL (ref 150–440)
RBC: 4.14 10*6/uL — ABNORMAL LOW (ref 4.40–5.90)
RDW: 14.6 % — ABNORMAL HIGH (ref 11.5–14.5)
WBC: 8.9 10*3/uL (ref 3.8–10.6)

## 2014-05-15 LAB — CREATININE, SERUM
Creatinine: 1.13 mg/dL (ref 0.60–1.30)
EGFR (Non-African Amer.): >60

## 2014-05-15 MED ORDER — ACETAMINOPHEN-CODEINE #3 300-30 MG PO TABS: 1.0000 | ORAL_TABLET | ORAL | Status: AC | PRN

## 2014-05-15 NOTE — Telephone Encounter (Signed)
Patient called requesting refill on tylenol #3 Prescription refilled and is at the front desk for pick up

## 2014-05-24 ENCOUNTER — Encounter: Payer: Self-pay | Admitting: Surgery

## 2014-06-20 ENCOUNTER — Emergency Department: Payer: Self-pay | Admitting: Emergency Medicine

## 2014-06-23 ENCOUNTER — Encounter: Payer: Self-pay | Admitting: Surgery

## 2014-07-01 ENCOUNTER — Emergency Department: Payer: Self-pay | Admitting: Emergency Medicine

## 2014-07-10 ENCOUNTER — Emergency Department: Payer: Self-pay | Admitting: Emergency Medicine

## 2014-07-10 LAB — BASIC METABOLIC PANEL
ANION GAP: 8 (ref 7–16)
BUN: 20 mg/dL — ABNORMAL HIGH (ref 7–18)
CALCIUM: 8.6 mg/dL (ref 8.5–10.1)
Chloride: 104 mmol/L (ref 98–107)
Co2: 28 mmol/L (ref 21–32)
Creatinine: 1.21 mg/dL (ref 0.60–1.30)
EGFR (Non-African Amer.): >60
GLUCOSE: 81 mg/dL (ref 65–99)
Osmolality: 281 (ref 275–301)
POTASSIUM: 4 mmol/L (ref 3.5–5.1)
SODIUM: 140 mmol/L (ref 136–145)

## 2014-07-10 LAB — CBC
HCT: 39.7 % — ABNORMAL LOW (ref 40.0–52.0)
HGB: 12.9 g/dL — ABNORMAL LOW (ref 13.0–18.0)
MCH: 29 pg (ref 26.0–34.0)
MCHC: 32.6 g/dL (ref 32.0–36.0)
MCV: 89 fL (ref 80–100)
Platelet: 241 10*3/uL (ref 150–440)
RBC: 4.45 10*6/uL (ref 4.40–5.90)
RDW: 14.3 % (ref 11.5–14.5)
WBC: 7.5 10*3/uL (ref 3.8–10.6)

## 2014-07-10 LAB — TROPONIN I: Troponin-I: <0.02 ng/mL

## 2014-07-25 ENCOUNTER — Emergency Department: Payer: Self-pay | Admitting: Emergency Medicine

## 2014-07-25 LAB — COMPREHENSIVE METABOLIC PANEL
ANION GAP: 7 (ref 7–16)
Albumin: 3.5 g/dL (ref 3.4–5.0)
Alkaline Phosphatase: 76 U/L
BILIRUBIN TOTAL: 0.8 mg/dL (ref 0.2–1.0)
BUN: 21 mg/dL — AB (ref 7–18)
CHLORIDE: 108 mmol/L — AB (ref 98–107)
CREATININE: 1.14 mg/dL (ref 0.60–1.30)
Calcium, Total: 8.5 mg/dL (ref 8.5–10.1)
Co2: 27 mmol/L (ref 21–32)
EGFR (African American): >60
EGFR (Non-African Amer.): >60
Glucose: 98 mg/dL (ref 65–99)
OSMOLALITY: 286 (ref 275–301)
POTASSIUM: 3.3 mmol/L — AB (ref 3.5–5.1)
SGOT(AST): 29 U/L (ref 15–37)
SGPT (ALT): 27 U/L
Sodium: 142 mmol/L (ref 136–145)
TOTAL PROTEIN: 8.3 g/dL — AB (ref 6.4–8.2)

## 2014-07-25 LAB — CBC WITH DIFFERENTIAL/PLATELET
BASOS ABS: 0 10*3/uL (ref 0.0–0.1)
BASOS PCT: 0.5 %
Eosinophil #: 0 10*3/uL (ref 0.0–0.7)
Eosinophil %: 0.3 %
HCT: 38.8 % — AB (ref 40.0–52.0)
HGB: 12.7 g/dL — AB (ref 13.0–18.0)
LYMPHS ABS: 0.9 10*3/uL — AB (ref 1.0–3.6)
Lymphocyte %: 10 %
MCH: 28.9 pg (ref 26.0–34.0)
MCHC: 32.8 g/dL (ref 32.0–36.0)
MCV: 88 fL (ref 80–100)
Monocyte #: 1.1 x10 3/mm — ABNORMAL HIGH (ref 0.2–1.0)
Monocyte %: 12.4 %
NEUTROS ABS: 6.6 10*3/uL — AB (ref 1.4–6.5)
Neutrophil %: 76.8 %
Platelet: 234 10*3/uL (ref 150–440)
RBC: 4.41 10*6/uL (ref 4.40–5.90)
RDW: 14.4 % (ref 11.5–14.5)
WBC: 8.6 10*3/uL (ref 3.8–10.6)

## 2014-07-25 LAB — TROPONIN I: Troponin-I: <0.02 ng/mL

## 2014-08-09 ENCOUNTER — Inpatient Hospital Stay: Payer: Self-pay | Admitting: Surgery

## 2014-08-09 LAB — CBC WITH DIFFERENTIAL/PLATELET
Basophil #: 0.1 10*3/uL (ref 0.0–0.1)
Basophil %: 0.7 %
EOS PCT: 2.5 %
Eosinophil #: 0.2 10*3/uL (ref 0.0–0.7)
HCT: 37.5 % — AB (ref 40.0–52.0)
HGB: 11.9 g/dL — ABNORMAL LOW (ref 13.0–18.0)
LYMPHS ABS: 1.3 10*3/uL (ref 1.0–3.6)
Lymphocyte %: 17.2 %
MCH: 27.8 pg (ref 26.0–34.0)
MCHC: 31.8 g/dL — ABNORMAL LOW (ref 32.0–36.0)
MCV: 87 fL (ref 80–100)
MONO ABS: 1.2 x10 3/mm — AB (ref 0.2–1.0)
MONOS PCT: 15.7 %
NEUTROS ABS: 4.8 10*3/uL (ref 1.4–6.5)
Neutrophil %: 63.9 %
PLATELETS: 301 10*3/uL (ref 150–440)
RBC: 4.3 10*6/uL — ABNORMAL LOW (ref 4.40–5.90)
RDW: 14 % (ref 11.5–14.5)
WBC: 7.6 10*3/uL (ref 3.8–10.6)

## 2014-08-09 LAB — BASIC METABOLIC PANEL
ANION GAP: 7 (ref 7–16)
BUN: 24 mg/dL — AB (ref 7–18)
CO2: 28 mmol/L (ref 21–32)
CREATININE: 1.35 mg/dL — AB (ref 0.60–1.30)
Calcium, Total: 8.3 mg/dL — ABNORMAL LOW (ref 8.5–10.1)
Chloride: 107 mmol/L (ref 98–107)
EGFR (Non-African Amer.): 59 — ABNORMAL LOW
Glucose: 103 mg/dL — ABNORMAL HIGH (ref 65–99)
Osmolality: 287 (ref 275–301)
Potassium: 3.9 mmol/L (ref 3.5–5.1)
SODIUM: 142 mmol/L (ref 136–145)

## 2014-08-09 LAB — HEMOGLOBIN A1C: Hemoglobin A1C: 6.7 % — ABNORMAL HIGH (ref 4.2–6.3)

## 2014-08-11 LAB — BASIC METABOLIC PANEL WITH GFR
Anion Gap: 7 (ref 7–16)
BUN: 16 mg/dL (ref 7–18)
Calcium, Total: 8.7 mg/dL (ref 8.5–10.1)
Chloride: 97 mmol/L — ABNORMAL LOW (ref 98–107)
Co2: 31 mmol/L (ref 21–32)
Creatinine: 1.15 mg/dL (ref 0.60–1.30)
EGFR (African American): >60
EGFR (Non-African Amer.): >60
Glucose: 88 mg/dL (ref 65–99)
Osmolality: 271 (ref 275–301)
Potassium: 4.1 mmol/L (ref 3.5–5.1)
Sodium: 135 mmol/L — ABNORMAL LOW (ref 136–145)

## 2014-08-14 LAB — CULTURE, BLOOD (SINGLE)

## 2014-10-20 IMAGING — CR DG TIBIA/FIBULA 2V*L*
1 series · 2 of 2 positions shown · non-contrast
Comparison: 12/11/2013

CLINICAL DATA: Multiple open wounds.  Evaluate for osteomyelitis.

EXAM:
LEFT TIBIA AND FIBULA - 2 VIEW

[Series 1: ap · 0.17mm/px · 2 of 2 slices shown]
[im 1/2]
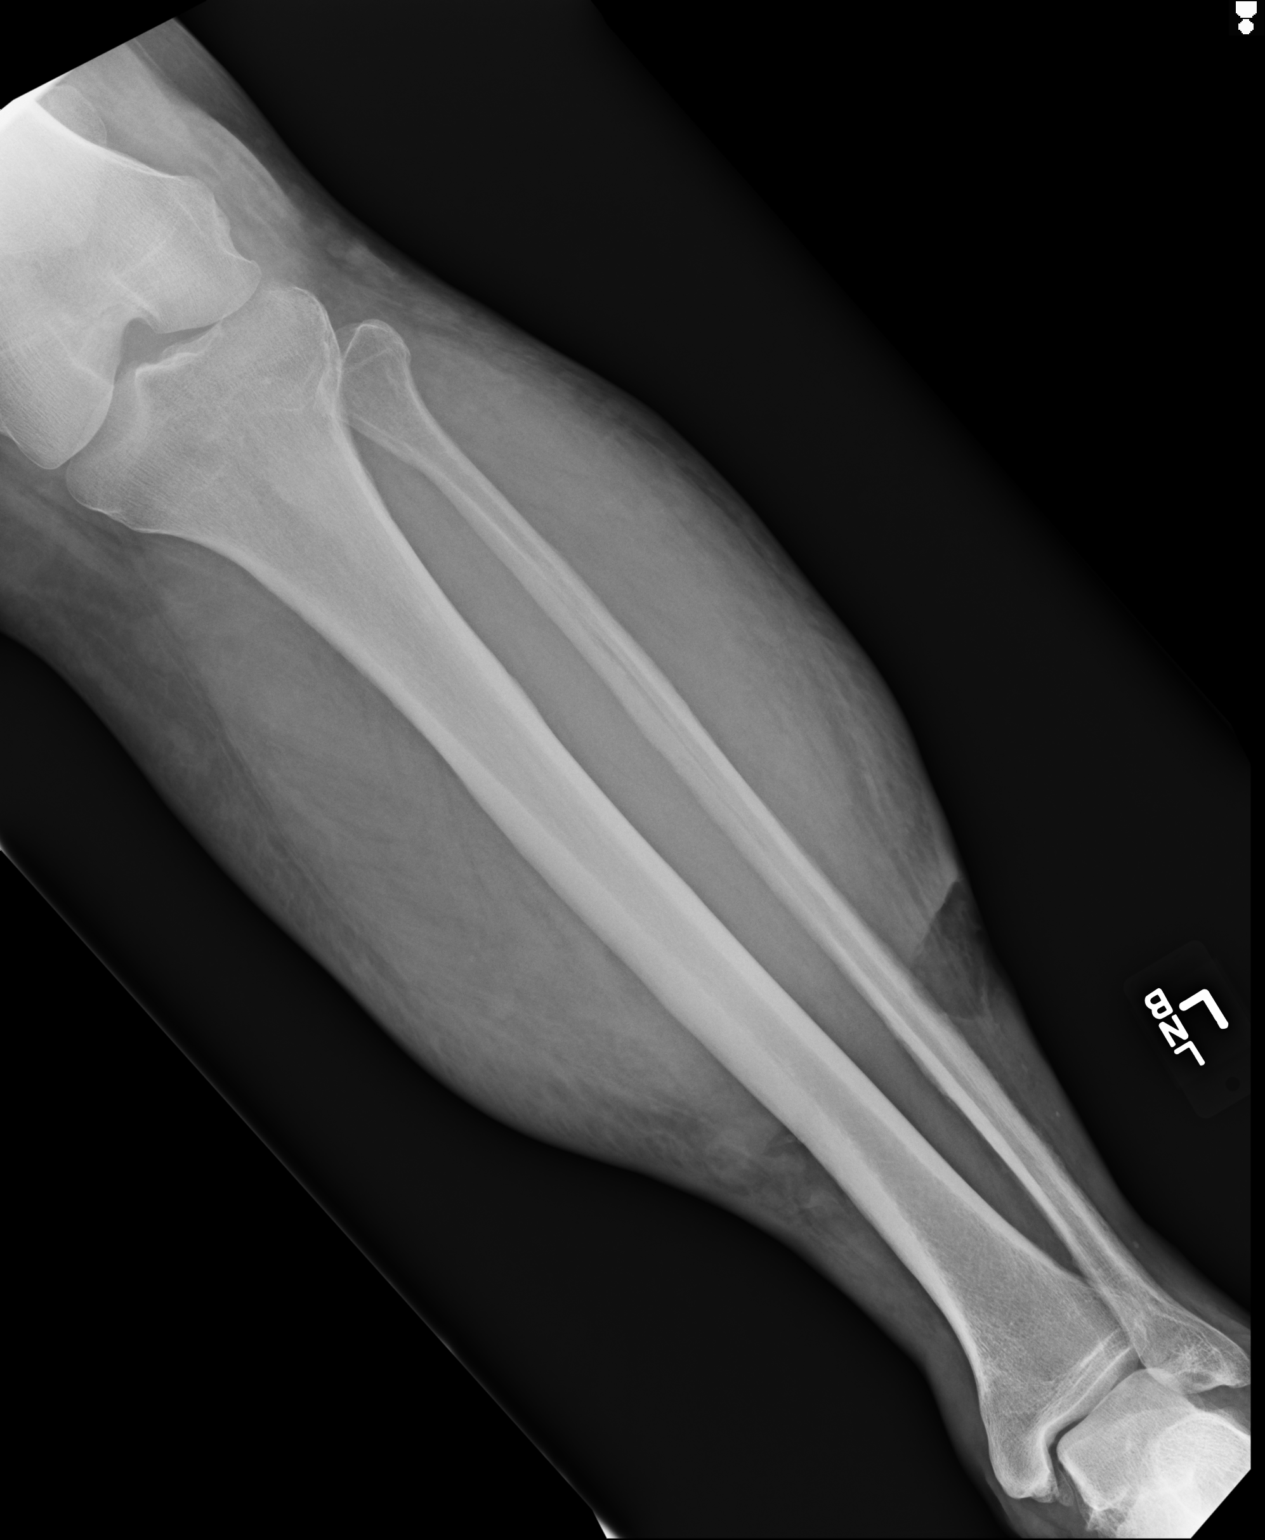
[im 2/2]
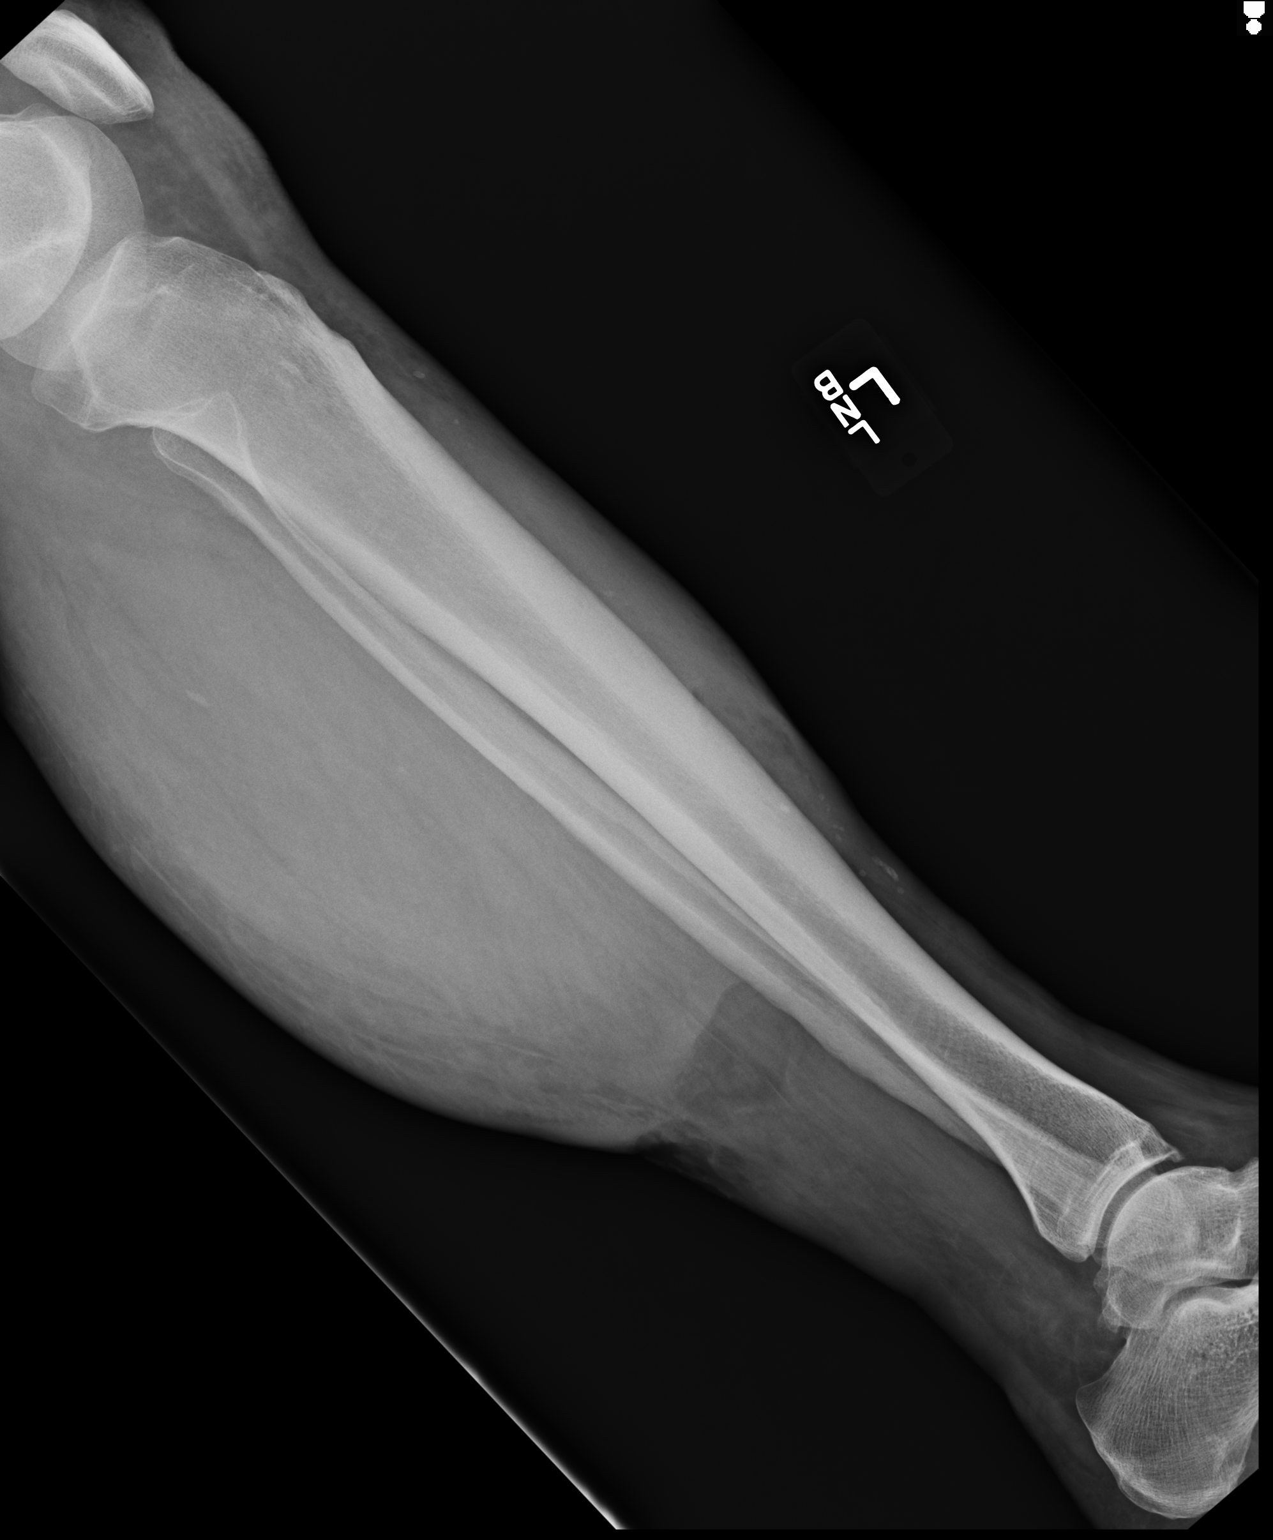

[2 of 2 positions shown; findings below may reference images not displayed]

FINDINGS: Large soft tissue defects noted over the lower calf. No underlying
acute bony abnormality to suggest osteomyelitis. No fracture,
subluxation or dislocation.
IMPRESSION: No acute bony abnormality.

## 2014-10-20 IMAGING — US US EXTREM LOW VENOUS*L*
1 series · 13 of 24 positions shown · non-contrast
Comparison: None.

CLINICAL DATA: Edema, erythema



[Series 1: us extrem low venous*left* · 0.08mm/px · 13 of 45 slices shown]
[im 1/45]
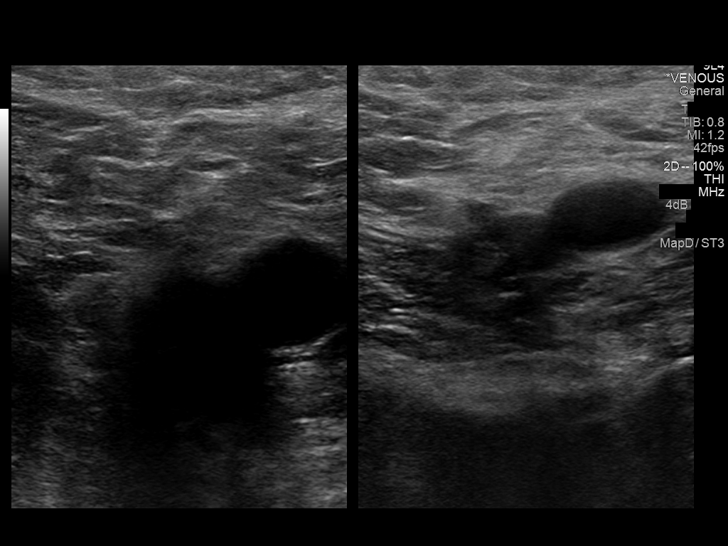
[im 4/45]
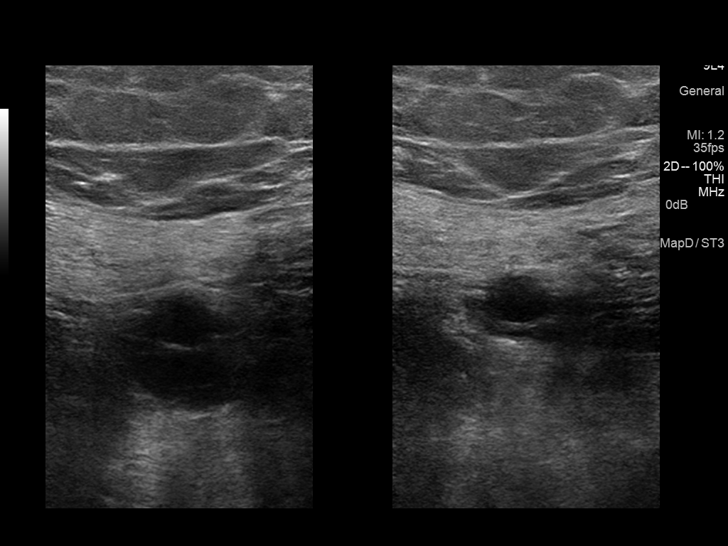
[im 8/45]
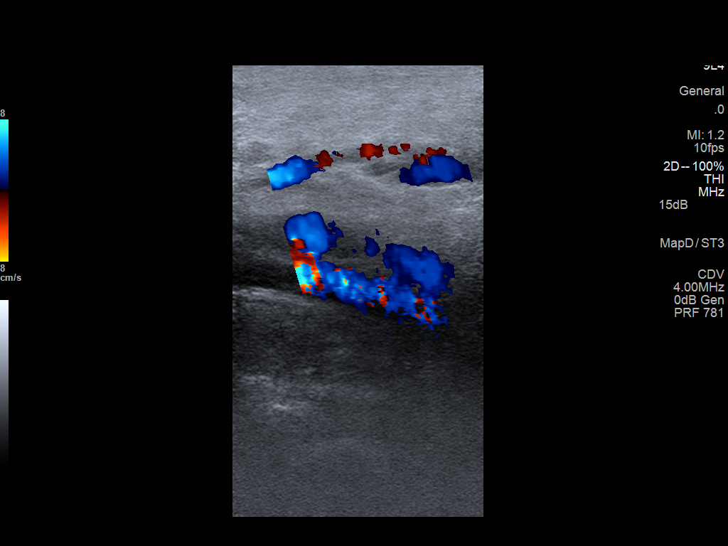
[im 12/45]
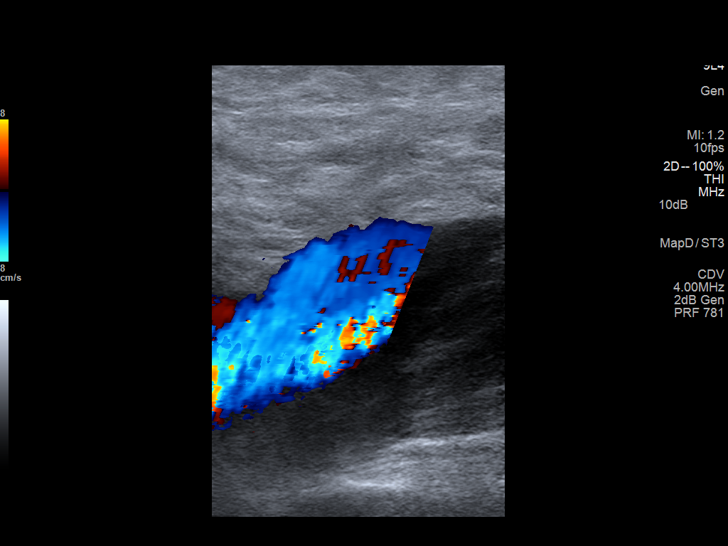
[im 16/45]
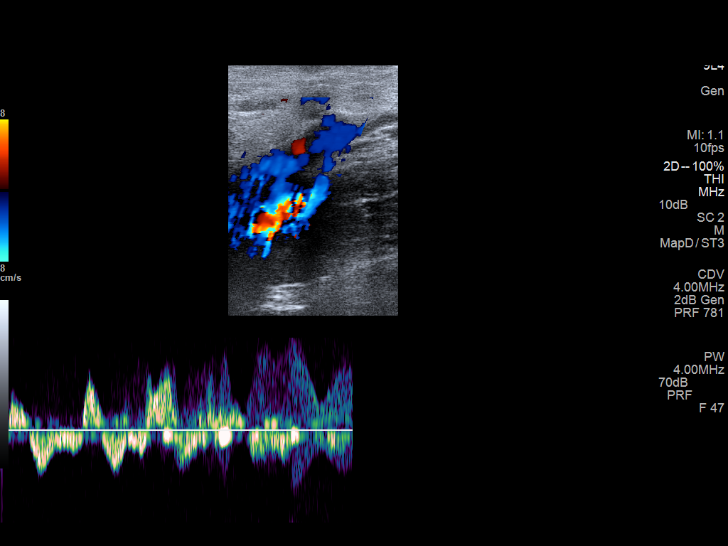
[im 20/45]
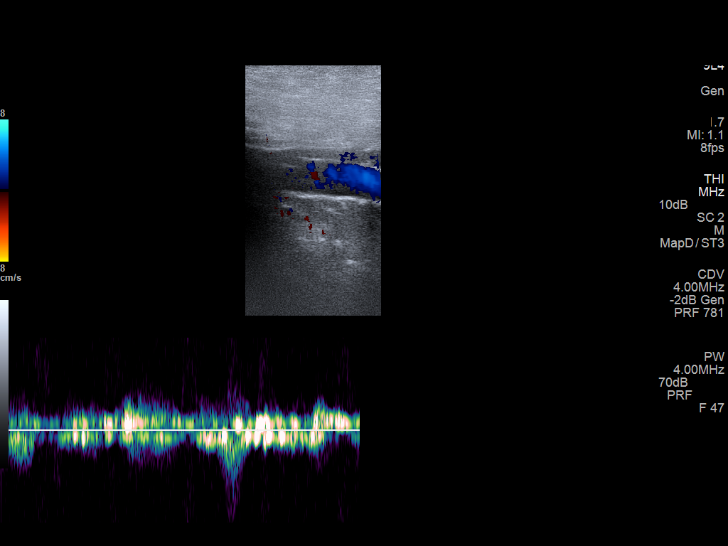
[im 23/45]
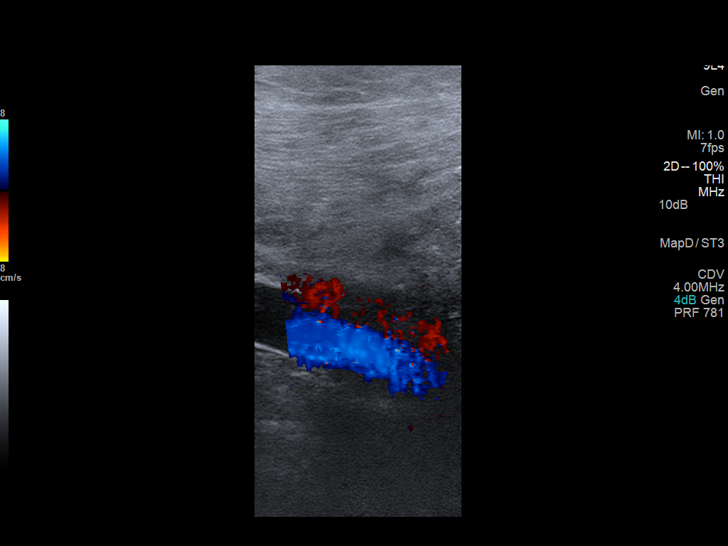
[im 25/45]
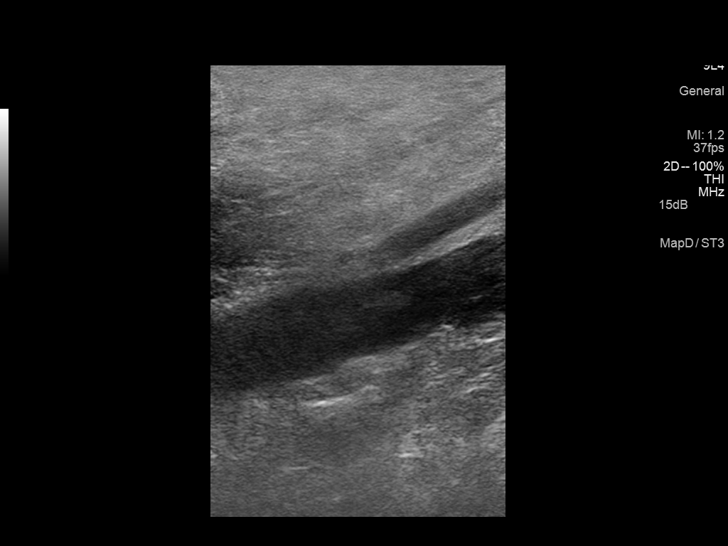
[im 29/45]
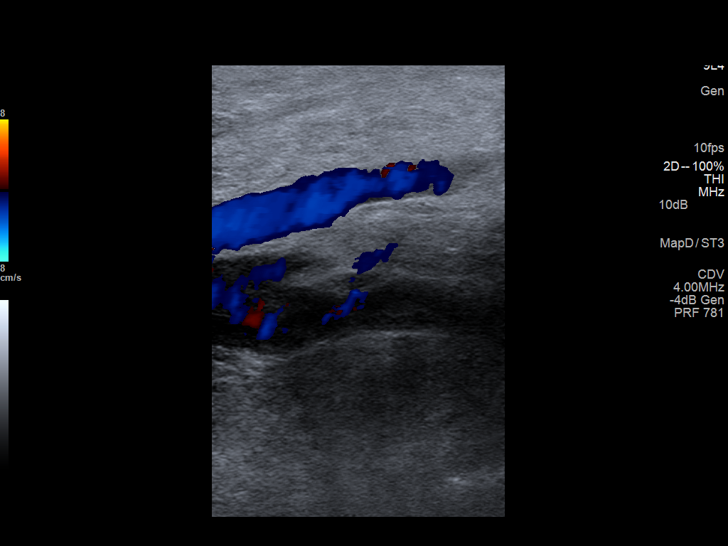
[im 33/45]
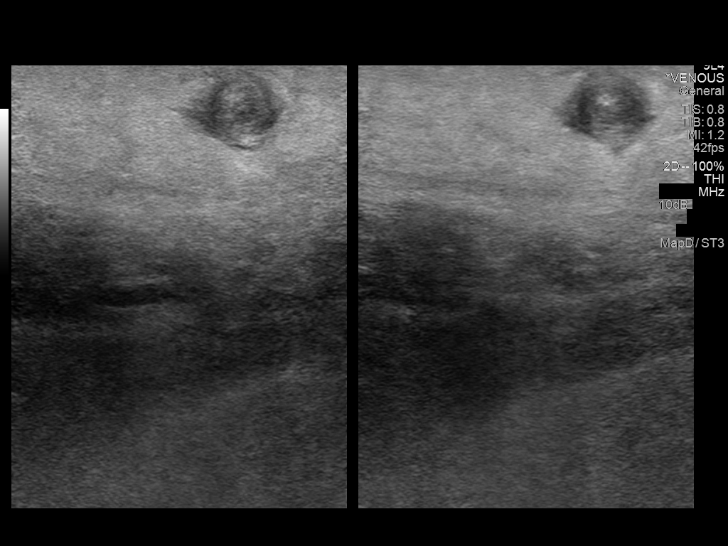
[im 37/45]
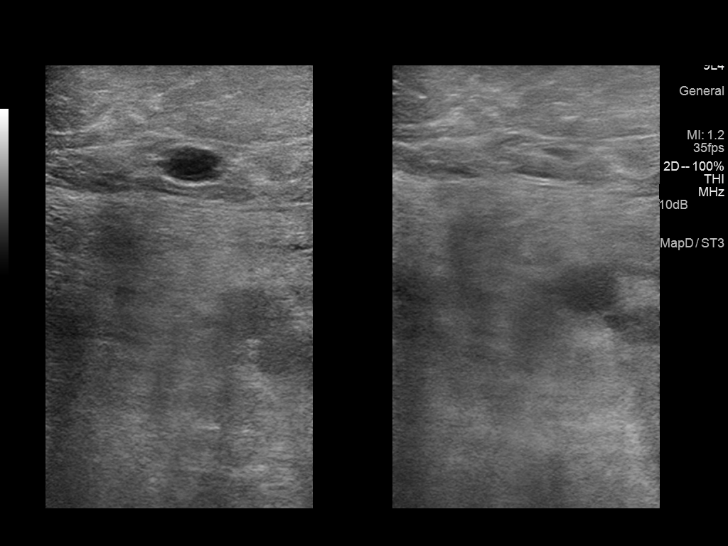
[im 41/45]
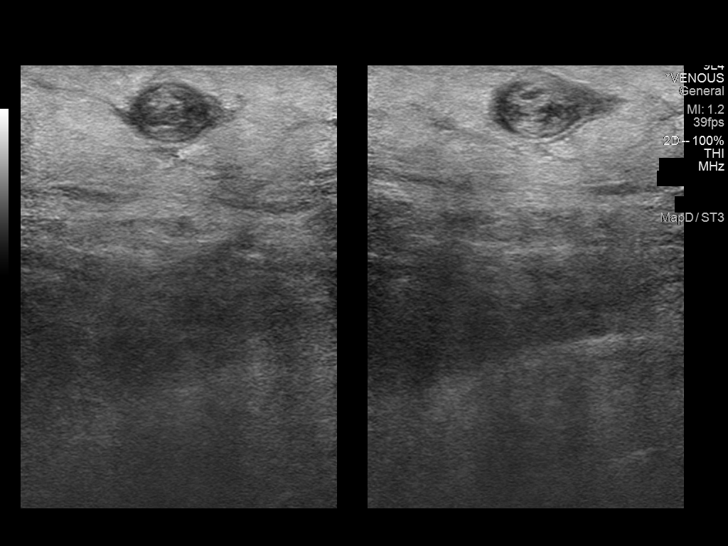
[im 45/45]
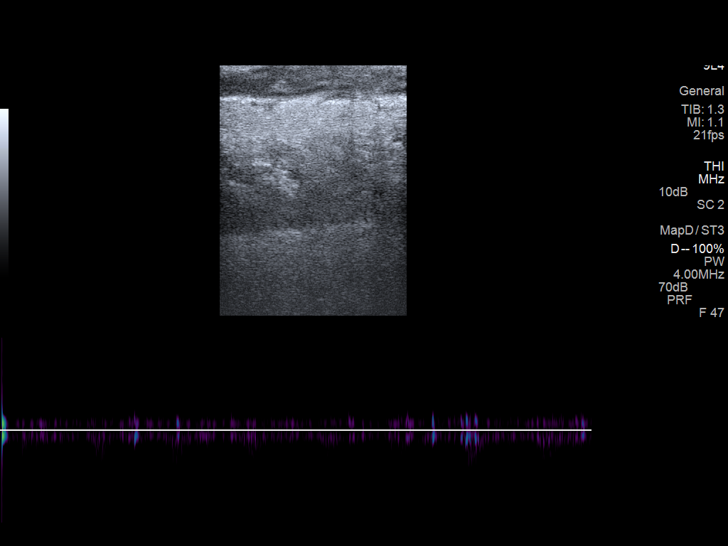

[13 of 24 positions shown; findings below may reference images not displayed]

FINDINGS: Common Femoral Vein: No evidence of thrombus. Normal
compressibility, respiratory phasicity and response to augmentation.

Saphenofemoral Junction: No evidence of thrombus. Normal
compressibility and flow on color Doppler imaging.

Profunda Femoral Vein: No evidence of thrombus. Normal
compressibility and flow on color Doppler imaging.

Femoral Vein: No evidence of thrombus. Normal compressibility,
respiratory phasicity and response to augmentation.

Popliteal Vein: Partially occlusive thrombus noted in the popliteal
vein with partial compressibility.

Calf Veins: No evidence of thrombus. Normal compressibility and flow
on color Doppler imaging.

Superficial Great Saphenous Vein: Thrombosis incomplete inclusion of
the greater saphenous vein within the calf. The vessel is mildly
distended suggesting this is an acute thrombosis.

Venous Reflux:  None.

Other Findings:  None.
IMPRESSION: Short segment deep venous thrombosis with partial occlusion in the
left popliteal vein.

Superficial thrombophlebitis of the greater saphenous vein in the
calf.

These results will be called to the ordering clinician or
representative by the Radiologist Assistant, and communication
documented in the PACS or zVision Dashboard.

## 2014-11-14 NOTE — H&P (Signed)
PATIENT NAME:  Jesse Mccoy, Kasem A MR#:  [id number] DATE OF BIRTH:  Oct 30, 1960  DATE OF ADMISSION:  01/30/2014  PRIMARY CARE PHYSICIAN: None.   CHIEF COMPLAINT: Left leg ulcers with pain and swelling.   HISTORY OF PRESENT ILLNESS: Mr. Jesse Mccoy is a 54 year old Caucasian male with past medical history significant for diabetes mellitus, non-insulin-dependent, history of  protein S. deficiency, and bilateral lower extremity clots on warfarin, has been noncompliant to his medications lately because of his move from [place] this state.  He is currently residing at the [facility] just since yesterday. He states that he has had lower extremity ulcers on the left leg for almost 6 years.  They healed well in the past, but over the last 3 weeks, they have started worsening again.  The one on the anterior portion of the shin on the leg has black necrotic eschar on top of it and the one to the lateral side is open and discharging some serosanguinous and purulent material. Denies any fevers or chills.  Complains of some nausea.    PAST MEDICAL HISTORY: 1.  Protein S deficiency.  2.  History of bilateral lower extremity DVT, currently off Coumadin due to noncompliance.  3.  Diabetes mellitus.   PAST SURGICAL HISTORY: Cholecystectomy.   ALLERGIES TO MEDICATIONS: NO KNOWN DRUG ALLERGIES.   CURRENT HOME MEDICATIONS:  1.  Supposed to be on warfarin, which he is not taking.  2.  Metformin 500 p.o. b.i.d. supposed to be on that and again not taking recently.  3.  Pain medications p.r.n.   SOCIAL HISTORY: Currently residing at [facility].  Does not smoke. Occasional alcohol use.  Use to do marijuana and cocaine in the past, none currently.   FAMILY HISTORY: Dad with heart disease. Mom with diabetes mellitus.  REVIEW OF SYSTEMS:  CONSTITUTIONAL: No fever, fatigue or weakness.  EYES: No blurred vision, double vision, inflammation, or glaucoma.  EAR, NOSE, AND THROAT: No tinnitus, ear  pain, hearing loss, epistaxis or discharge.  RESPIRATORY: No cough, wheeze, hemoptysis or chronic obstructive pulmonary disease.  CARDIOVASCULAR: No chest pain, orthopnea, edema, arrhythmia, palpitations, or syncope.  GASTROINTESTINAL: No nausea, vomiting, diarrhea, abdominal pain, hematemesis, or melena.  GENITOURINARY: No dysuria, frequency, renal calculus, hesitancy, or incontinence.  ENDOCRINE: No polyuria, nocturia, thyroid problems, heat or cold intolerance.  HEMATOLOGY: No anemia, easy bleeding. No bleeding.  SKIN: No acne or rash. Positive for ulcerations on the left lower extremity.  MUSCULOSKELETAL: Positive for knee arthritis.  No gout.  NEUROLOGIC: No numbness, weakness, CVA, TIA, or seizures.  PSYCHOLOGICAL: No anxiety, insomnia, or depression.   PHYSICAL EXAMINATION:  VITAL SIGNS: Temperature 98.7 degrees Fahrenheit, pulse 77, respirations 18, blood pressure 120/69, pulse oximetry 98% on room air.  GENERAL: Well-built, well-nourished male lying in bed, not in any acute distress.  HEENT: Normocephalic, atraumatic. Pupils equal, round, reacting to light. Anicteric sclerae. Extraocular movements intact.  Oropharynx without erythema, mass or exudates.  NECK: Supple. No thyromegaly, JVD or carotid bruits. No lymphadenopathy.  LUNGS: Moving air bilaterally. No wheeze or crackles. No use of accessory muscles for breathing.  CARDIOVASCULAR: S1, S2, regular rate and rhythm. No murmurs, rubs, or gallops.  ABDOMEN: Soft, nontender, nondistended. No hepatosplenomegaly. Normal bowel sounds.  EXTREMITIES: No pedal edema. No clubbing or cyanosis, 2+ dorsalis pedis pulses palpable bilaterally. The left lower extremity has 2 ulcers.  The first one is about 3 x  2 cm on the anterior portion, has a black eschar on top of  it, and the other one is 4 x  4 cm on the lateral side, which is open with good granulation tissue in the middle, but the edges have dark tissue and is discharging some serosanguinous  material at this time.   SKIN: Otherwise, other than the ulcers described above, no other rash or lesions.  LYMPHATICS: No cervical lymphadenopathy.  NEUROLOGIC: Cranial nerves are intact. No focal motor or sensory deficits.  PSYCHOLOGICAL: The patient is awake, alert, oriented x 3.   LABORATORY DATA: WBC 8.0, hemoglobin 12.7, hematocrit 38.8, platelet count 245,000.   Sodium 140, potassium 4.3, chloride 107, bicarbonate 28, BUN 17, creatinine 1.27, glucose 99 and calcium of 8.4.    IMAGING STUDIES:  Left tibia-fibula x-ray showing no evidence of any osteomyelitis. No bony abnormality. Ultrasound of the left lower extremity showing short segment DVT with partial occlusion in left popliteal vein, so recent thrombophlebitis of the great saphenous vein in the calf.   ASSESSMENT AND PLAN: A 54 year old male that is history of diabetes mellitus, protein S deficiency, history of deep vein thrombosis not taking Coumadin, comes with acute on chronic left leg ulcerations with early cellulitis changes and also deep vein thrombosis.   1.  Early cellulitis of the left leg with 2 open ulcers. Wound care consult versus tissue debridement and dosing instructions.  Wound cultures have been ordered. Started on vancomycin and Unasyn.  Likely can be discharged tomorrow on oral antibiotics.   2.  Deep vein thrombosis.  There has been deep vein thrombosis in the left leg with thrombophlebitis, probably this could have been chronic clots due to the patient not taking his warfarin at this time. We will start on Xarelto and care management consulted for medication assistance program.   3.  Diabetes mellitus. Check HbA1c.  He takes only metformin at home, which we will continue and place on sliding scale insulin.   4.  CODE STATUS: FULL CODE.   TIME SPENT ON ADMISSION: 50 minutes.     ____________________________ Enid Baas, MD rk:ts D: 01/30/2014 11:16:23 ET T: 01/30/2014 11:40:40  ET JOB#: [id number]  cc: Enid Baas, MD, <Dictator> Enid Baas MD ELECTRONICALLY SIGNED 02/11/2014 13:30

## 2014-11-14 NOTE — Op Note (Signed)
PATIENT NAME:  Jesse Mccoy, Jesse Mccoy MR#:  [id number] DATE OF BIRTH:  Aug 06, 1960  DATE OF PROCEDURE:  05/16/2014  ATTENDING PHYSICIAN: Cristal Deerhristopher Mccoy. Jemari Hallum, MD  PREOPERATIVE DIAGNOSIS: Venous stasis ulcers with necrosis.   POSTOPERATIVE DIAGNOSIS: Venous stasis ulcers with necrosis.   PROCEDURE PERFORMED: Debridement of ulcers with necrosis 5 x 5 cm, 5 x 7 cm and 4 x 4 cm.   ANESTHESIA: General.   ESTIMATED BLOOD LOSS: 5 L  INDICATION FOR SURGERY: [name] is Mccoy pleasant 54 year old gentleman who presents with Mccoy grungy necrosis at his previous venous stasis ulcers. He was brought to the operating room for debridement to facilitate wound healing.   DETAILS OF PROCEDURE: [name]. Consent was performed prior to the procedure. He was induced. Endotracheal tube was placed. General anesthesia was administered. His left leg was prepped and draped in standard surgical fashion. Mccoy timeout was then performed, correctly identifying the patient name, operative site and procedure to be performed. I used Mccoy scalpel to debride all necrotic tissue to Mccoy nice healthy wound bed. After I was happy with the debridement, I placed Mccoy wet-to-dry Kerlix and an ABDs pad, and then used Mccoy Kerlix.   The patient was woken, extubated and brought to the postanesthesia care unit. There were no immediate complications. Needle, sponge, and instrument count was correct at the end of the procedure.    ____________________________ Si Raiderhristopher Mccoy. Zyair Russi, MD cal:MT D: 05/16/2014 08:57:47 ET T: 05/16/2014 16:04:04 ET JOB#: [id number]  cc: Cristal Deerhristopher Mccoy. Talulah Schirmer, MD, <Dictator> Jarvis NewcomerHRISTOPHER Mccoy Mithcell Schumpert MD ELECTRONICALLY SIGNED 05/26/2014 7:04

## 2014-11-14 NOTE — Discharge Summary (Signed)
PATIENT NAME:  Jesse Mccoy, Jesse Mccoy MR#:  [id number] DATE OF BIRTH:  1960-10-29  DATE OF ADMISSION:  01/30/2014 DATE OF DISCHARGE:    PRIMARY CARE PHYSICIAN: Whoever is on his Medicaid card. The patient will also [facility].   FINAL DIAGNOSES:  1. Cellulitis and 3 ulcers on leg.  2. Diabetes.  3. 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   MEDICATIONS ON DISCHARGE: Include Xarelto 15 mg twice Mccoy day for 16 more days then changed to 20 mg once Mccoy day in the evening, acetaminophen hydrocodone 325/5 one tablet every 8 hours as needed for pain, metformin 500 mg daily, Cipro 500 mg every 12 hours for 14 more days.   DIET: Carbohydrate-controlled diet, regular consistency.   ACTIVITY: As tolerated.   FOLLOWUP: At the [facility]  in 2 to 4 weeks, medical doctor on your Medicaid card.   HOSPITAL COURSE: The patient was admitted initially as an observation on 01/30/2014, discharged 02/04/2014. Came in with cellulitis of the left leg with ulcers. The patient was initially started on vancomycin and Unasyn. The patient also had Mccoy DVT and was started on Xarelto.   LABORATORY AND RADIOLOGICAL DATA DURING THE HOSPITAL COURSE: Included Mccoy hemoglobin A1c of 6.4, INR 1.0, sedimentation rate 1. White blood cell count 8.0, hemoglobin and hematocrit 12.7 and 38.8, platelet count of 245,000. Glucose 99, BUN 17, creatinine 1.29, sodium 140, potassium 4.3, chloride 107, CO2 of 28, calcium 8.4.  Tibia and fibula x-ray, no acute bony abnormality.   Ultrasound of the left lower extremity showed short segment DVT, partial occlusion of the left popliteal vein, superficial thrombophlebitis of the greater saphenous vein in the calf.   Wound culture grew out heavy growth of Staphylococcus aureus, light growth of Escherichia coli.   Urine toxicology positive for cocaine.   Rheumatoid factor negative. White blood cell count upon discharge 7.8, hemoglobin 12.4, creatinine 1.06.   ASSESSMENT  AND PLAN:  1. Cellulitis and 3 ulcers on the leg. As per the patient, these have been nonhealing ulcers for Mccoy while and they are draining some erythema around the leg and over the medial calf, probably over the saphenous deep vein thrombosis. I did speak with Dr. Juliann PulseLundquist and Dr. Egbert GaribaldiBird, surgery, who agreed no surgical intervention needed and just follow up at the [facility]. The patient was initially on vancomycin and Unasyn. Since the cultures of the Staphylococcus and Escherichia coli were both sensitive to Cipro, I switched the patient over to Cipro. I will give Mccoy little longer course because the patient still had Mccoy little erythema over the saphenous vein in the leg and some tenderness there to palpation. We will give another 14 days.  2. Diabetes. Hemoglobin A1c 6.4. The patient on metformin once Mccoy day.  3. 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 Put on Xarelto given Xarelto 2 prescriptions, one for 16 days twice Mccoy day 15 mg, and one for 30 days once Mccoy day 20 mg. That should be enough time for him to get to Mccoy primary care physician.   TIME SPENT ON DISCHARGE: 35 minutes.    ____________________________ Herschell Dimesichard J. Renae GlossWieting, MD rjw:jr D: 02/04/2014 15:48:07 ET T: 02/04/2014 17:13:49 ET JOB#: [id number]  cc: Herschell Dimesichard J. Renae GlossWieting, MD, <Dictator> [facility]   Salley ScarletICHARD J Rozelle Caudle MD ELECTRONICALLY SIGNED 02/05/2014 12:41

## 2014-11-14 NOTE — H&P (Signed)
   Subjective/Chief Complaint Erythema/drainage from chronic leg ulcers   History of Present Illness Mr. Jesse Mccoy is a pleasant 54 yo M with a history of chronic venous stasis ulcers of his LLE who has a 1 week history of worsening pain, redness and foul odor from leg wound.  These have been there for months prior.  He was sent from wound care center for wound debridement.  History of DVT in left leg which he was on xarelto for but has not been taking.  Currently extremely painful. Otherwise doing well.   Past History Protein S deficiency H/o bilateral DVT for which he has taken xarelto but is no longer taking DM H/o cholecystectomy   Past Medical Health Diabetes Mellitus   Code Status Full Code   Past Med/Surgical Hx:  ulcers lower extremities - sees wound care:   Protein S def.:   diabetes:   Wrist Surgery - Left: Fell off ladder.  Metal plate/surgery.  Cholecystectomy:   ALLERGIES:  No Known Allergies:   Family and Social History:  Family History Coronary Artery Disease  Diabetes Mellitus   Social History negative tobacco, negative ETOH, negative Illicit drugs   Place of Living Home   Review of Systems:  Subjective/Chief Complaint Painful left leg ulcers with cellulitis and exudate   Fever/Chills No   Cough No   Sputum No   Abdominal Pain No   Diarrhea No   Constipation No   Nausea/Vomiting No   Chest Pain No   Dysuria No   Tolerating PT No   Tolerating Diet Yes   Physical Exam:  GEN well developed, well nourished, no acute distress   HEENT pink conjunctivae, PERRL, moist oral mucosa   RESP normal resp effort  clear BS   CARD regular rate  no murmur   ABD denies tenderness  no hernia  soft   EXTR 3 large approx 2.5 x 2.5 cm circumferential leg ulcers, left leg with assocaited erythema   SKIN normal to palpation, No rashes, positive ulcers   NEURO cranial nerves intact   PSYCH A+O to time, place, person    Assessment/Admission Diagnosis Mr.  Jesse Mccoy is a pleasant 54 yo M with likely cellulitis around leg ulcers which are in need of debridement and associated pain.   Plan Admit, IV antiobitics. OR tomorrow for debridement.  IM consult for assistance with anticoagulation which he should be on.   Electronic Signatures: Jarvis NewcomerLundquist, Malikah Principato A (MD)  (Signed 23-Oct-15 11:55)  Authored: CHIEF COMPLAINT and HISTORY, PAST MEDICAL/SURGIAL HISTORY, ALLERGIES, FAMILY AND SOCIAL HISTORY, REVIEW OF SYSTEMS, PHYSICAL EXAM, ASSESSMENT AND PLAN   Last Updated: 23-Oct-15 11:55 by Jarvis NewcomerLundquist, Dene Nazir A (MD)

## 2014-11-14 NOTE — Consult Note (Signed)
PATIENT NAME:  Jesse Mccoy, Hernando A MR#:  [id number] DATE OF BIRTH:  1960-10-20  DATE OF CONSULTATION:  01/31/2014  REFERRING PHYSICIAN:   CONSULTING PHYSICIAN:  Cristal Deerhristopher A. Dontasia Miranda, MD  REASON FOR CONSULTATION: Persistent and worsening left leg ulcers with pain and swelling of the lower extremity.   HISTORY OF PRESENT ILLNESS: Jesse Mccoy is a pleasant 54 year old male with a history of diabetes mellitus, Protein S deficiency, and bilateral DVT on warfarin who had recently moved from [place]. He has been noncompliant with his Coumadin in particular, and has had lower extremity ulcers on his left leg for 6 years, and over the past 3 weeks, it started worsening as there are 3 of them: One anterior medial, 1 posterior, and 1 posterolateral.  Otherwise, he has had no fevers, chills, night sweats, shortness of breath, cough, chest pain, abdominal pain, nausea, vomiting, diarrhea, constipation, dysuria or hematuria.  He did have an ultrasound which showed left popliteal DVT.   PAST MEDICAL HISTORY:  As follows:  1.  Protein S deficiency.  2.  A history bilateral lower extremity DVT for which he takes Coumadin.  3.  Diabetes mellitus.  4.  History of cholecystectomy.   ALLERGIES:  NO KNOWN DRUG ALLERGIES.  HOME MEDICATIONS:  1.  Warfarin, which he is not taking.  2.  Metformin.  3.  Pain medicines p.r.n.   SOCIAL HISTORY: Resides in [facility]. Denies tobacco use.  Social alcohol use. No illegal drug use currently.   FAMILY HISTORY: Mom with diabetes. Dad with heart disease.   REVIEW OF SYSTEMS: Twelve-point review of systems was obtained. Pertinent positives and negatives as above.   PHYSICAL EXAMINATION:  VITAL SIGNS: Temperature 98.8, pulse 87, blood pressure 119/76, respirations 18.  GENERAL: No acute distress.  Alert and oriented x 3. HEAD:  Normocephalic, atraumatic.  EYES: No scleral icterus. No conjunctivitis. FACE:  An obese face. THROAT:  Within normal  limits. NOSE:  Within normal limits. EARS:  Within normal limits.  CHEST: Lungs clear to auscultation.  HEART: Regular rate and rhythm. No murmurs, rubs, or gallops.  ABDOMEN: Soft, nontender, nondistended.  EXTREMITIES: The left lower extremity with below the knee swelling, as well as some ecchymoses, has 3 ulcers circumferentially around his leg. The largest, of which, is approximately 2 x 2 cm. The smallest of which is 2 cm in diameter. The largest is approximately 3-4 cm.  All have a nice granulating wound bases, minimal scabbing. No obvious purulence. Minimal dead tissue.   LABORATORY DATA: As follows: White cell count 7.8, hemoglobin 12.9, hematocrit 38.7, platelets 233,000.  Urinalysis positive for cocaine yesterday. Complete metabolic profile, otherwise, remarkable.  A1c 6.4.   ASSESSMENT AND PLAN: Jesse Mccoy is a pleasant 54 year old with history of protein S deficiency and deep vein thrombosis and possible venous stasis ulcers versus tissue necrosis secondary to protein S deficiency ulcers. These are relatively clean and have good granulation bases.  I would recommend wound care consult.  No need for debridement.  We will sign off. Please call with any questions or concerns. No surgical issues.   ____________________________ Si Raiderhristopher A. Worthy Boschert, MD cal:ts D: 01/31/2014 10:30:51 ET T: 01/31/2014 11:09:57 ET JOB#: [id number]  cc: Cristal Deerhristopher A. Deionte Spivack, MD, <Dictator> Jarvis NewcomerHRISTOPHER A Donavon Kimrey MD ELECTRONICALLY SIGNED 02/01/2014 18:16

## 2014-11-14 NOTE — Consult Note (Signed)
PATIENT NAME:  Jesse Mccoy, WARSHAW MR#:  [id number] DATE OF BIRTH:  20-Oct-1960  DATE OF CONSULTATION:  05/15/2014  REFERRING PHYSICIAN:  Ida Rogue, MD CONSULTING PHYSICIAN:  Hope Pigeon. Elisabeth Pigeon, MD  REASON FOR CONSULTATION: Anticoagulation and medical management.   CHIEF COMPLAINT: Infected ulcer on the leg.   HISTORY OF PRESENT ILLNESS: This is a 54 year old male who has a history of protein S deficiency and recurrent DVTs. He has diabetes and has chronic ulcer on his left leg which is nonhealing and infected and he follows with wound care clinic for that. He went to wound care clinic and they tried to do some debridement, but it was badly infected and they say they could not do it without anesthesia and he is to go to hospital for that, so sent him to the Emergency Room, so he was seen by surgery doctor and admitted for debridement scheduled for tomorrow morning and medical consult was called in as he is noncompliant to rearrange his anticoagulation medication. On further questioning, he denies any complaints.   REVIEW OF SYSTEMS: CONSTITUTIONAL: Negative for fever, fatigue, weakness, pain or weight loss.  EYES: No blurring or double vision, discharge or redness.  EARS, NOSE, THROAT: No tinnitus, ear pain or hearing loss.  RESPIRATORY: No cough, wheezing, hemoptysis, or shortness of breath.  CARDIOVASCULAR: No chest pain, orthopnea, edema, arrhythmia or palpitation.  GASTROINTESTINAL: No nausea, vomiting, diarrhea, abdominal pain.  GENITOURINARY: No dysuria, hematuria or increased frequency.  ENDOCRINE: No heat or cold intolerance. No excessive sweating.  SKIN: The patient has nonhealing ulcer on the left leg.  JOINTS: No swelling or tenderness.  NEUROLOGIC: No numbness, weakness, tremor or rigidity.  PSYCHIATRIC: No anxiety, insomnia, bipolar disorder.   PAST MEDICAL HISTORY:  1.  Deep vein thrombosis. 2.  Protein S deficiency.  3.  Diabetes.  4.  Recurrent infected  nonhealing ulcer on left leg.   PAST SURGICAL HISTORY: 1.  Cholecystectomy. 2.  Surgery for his ulcer and debridements.  SOCIAL HISTORY: No smoking. No alcohol. Marijuana in the past, not currently.   FAMILY HISTORY: Father had heart disease and mother had diabetes.   HOME MEDICATIONS: Was supposed to take Coumadin and metformin, but was not taking anything for the last 1 to 2 months as he does not have primary care physician currently. On last admission he was discharged on Xarelto with free trial packet. He finished that one and after that started taking and did not go to doctor.  PHYSICAL EXAMINATION: VITAL SIGNS: Temperature 97.6, pulse 60, respirations 18, blood pressure 116/71, and pulse ox is 100 on room air.  GENERAL: The patient is fully alert and oriented to time and person. Does not appear in acute distress.  HEENT: Head and neck atraumatic. Conjunctivae pink. Oral mucosa moist.  NECK: Supple. No JVD.  RESPIRATORY: Bilateral equal and clear air entry.  CARDIOVASCULAR: S1 and S2 present, regular. No murmur.  ABDOMEN: Soft, nontender. Bowel sounds present. No organomegaly. SKIN: No rashes. There is an ulcer and dressing present on the left leg.  NEUROLOGICAL: Power 5 out of 5. Follows commands. No tremor or rigidity. Moves all 4 limbs.  PSYCHIATRIC: Does not appear in any acute psychiatric illness.  JOINTS: No swelling or tenderness.   IMPORTANT LABORATORY RESULTS: No labs are collected yet as the patient is sent for direct admission by surgical team.  ASSESSMENT AND PLAN: A 54 year old male with history of protein S deficiency and deep vein thrombosis and has diabetes and nonhealing infected ulcer on  the left leg, came to the Emergency Room with complaint of worsening ulcer, in need of debridement under anesthesia. Medical consult for anticoagulation management suggestions.  1.  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 As the patient is noncompliant to the treatment,  because of multiple reasons, not having primary care physician and moved and insurance issues, I counseled him about the requirement of this and explaining the importance he understands and agreed to continue taking it for now, but then again he says that social worker or case manager or somebody helped him to make calls as he lost his Medicaid card and to find out him a new physician. As he is going for surgery tomorrow, currently I will just give him Lovenox 1 mg/kg subcutaneous dose 1 time and will not give anything in the evening. He has surgery tomorrow morning. After that he could be restarted on Xarelto. I explained to him about the co-pay would be mostly only 3 dollars with his Medicaid. He has to find a physician to continue getting the medicine. He understands and agrees for that. Currently, there is no swelling or shortness of breath. 2.  Diabetes. Again, he is noncompliant to his metformin and not checking his blood sugar. We will just keep him on insulin sliding scale coverage currently and we might resume his metformin after surgery is required.  3.  Infected nonhealing ulcer. He was started on vancomycin and Zosyn by surgery and is going for debridement under anesthesia tomorrow. We can proceed for the surgery without any further requirement of any intervention for changes in the medication, and we will continue following him for any medical issues.  CODE STATUS: FULL code.  TOTAL TIME SPENT ON THIS CONSULTATION: 50 minutes.  ____________________________ Hope PigeonVaibhavkumar G. Elisabeth PigeonVachhani, MD vgv:sb D: 05/15/2014 14:46:57 ET T: 05/15/2014 15:53:18 ET JOB#: [id number]  cc: Hope PigeonVaibhavkumar G. Elisabeth PigeonVachhani, MD, <Dictator> Altamese DillingVAIBHAVKUMAR Eilis Chestnutt MD ELECTRONICALLY SIGNED 06/03/2014 22:11

## 2014-11-14 NOTE — Consult Note (Signed)
Brief Consult Note: Diagnosis: Infected ulcer, DVT and DM.   Patient was seen by consultant.   Consult note dictated.   Recommend to proceed with surgery or procedure.   Orders entered.   Comments: Pt is non complaint with treatment- off coumadin for last 2 mths.  DVT- Lovenox 1mg /kg subcutaneous BID- hold for surgery tonight. DM- insulin sliding scale coverage,.  Electronic Signatures: Altamese DillingVachhani, Kobyn Kray (MD)  (Signed 23-Oct-15 14:19)  Authored: Brief Consult Note   Last Updated: 23-Oct-15 14:19 by Altamese DillingVachhani, Gwenith Tschida (MD)

## 2014-11-22 NOTE — Discharge Summary (Signed)
PATIENT NAME:  Jesse PaganFRANKS, Kermitt A MR#:  [id number] DATE OF BIRTH:  06-01-1961  DATE OF ADMISSION:  08/09/2014 DATE OF DISCHARGE:    PRINCIPLE DIAGNOSIS: Left lower extremity venous stasis ulcer.   OTHER DIAGNOSES: History of deep venous thrombosis, protein S deficiency, history of gross noncompliance with medical therapy.  PRINCIPLE PROCEDURE PERFORMED DURING THIS ADMISSION: On 08/09/2014 left lower extremity debridement.   HOSPITAL COURSE: The patient was admitted to the hospital, placed on Xarelto postoperative after he underwent the above-mentioned procedure for the above-mentioned diagnosis. On postoperative day 1 he was in inordinate amount of pain and therefore I adjusted his medication to 100 mg of extended-release oral morphine every 12 hours scheduled. He was seen by the wound care nurse and her suggestions were implemented. He had had 245 mg of extended-release morphine and 90 mg of immediate release morphine by postoperative day 2 in the previous 24 hours and his pain was well controlled, but his pupils were 2 mm and he was sleeping but easily aroused, and once aroused he was oriented but his speech was a little bit slurred. The following day, he had received his nighttime dose but in the morning his dose of extended-release morphine was held due to oversedation. By the afternoon, he was awake and oriented and his pain was controlled, and therefore I decreased his extended-release morphine from 100 mg q. 12 hours to 30 mg q. 12 hours. He still had 15 mg immediate release oral morphine q. 2 hours p.r.n. breakthrough pain, but none of that had been utilized since the day before.   At the time of discharge, his medications, which he was given prescriptions for, were as follows: Xarelto 20 mg daily, Pepcid 20 mg b.i.d., morphine extended release 30 mg q. 12 hours, morphine immediate release 15 mg q. 2 hours p.r.n. breakthrough pain.  At the time of this dictation, arrangements are being made either  for transfer to a skilled nursing facility out of [place] or for discharge home, as the patient was currently undecided.    ____________________________ Claude MangesWilliam F. Tarl Cephas, MD wfm:TT D: 08/12/2014 15:42:27 ET T: 08/12/2014 16:29:41 ET JOB#: [id number]  cc: Claude MangesWilliam F. Posey Jasmin, MD, <Dictator> Claude MangesWILLIAM F Jiali Linney MD ELECTRONICALLY SIGNED 08/14/2014 13:53

## 2014-11-22 NOTE — H&P (Signed)
PATIENT NAME:  Jesse Mccoy, Jesse Mccoy MR#:  [id number] DATE OF BIRTH:  04/28/1961  DATE OF ADMISSION:  08/09/2014  PRIMARY CARE PHYSICIAN: None.   ADMITTING PHYSICIAN: Jesse Mccoy, Jesse Mccoy, M.D.   CHIEF COMPLAINT: Left lower extremity venous stasis disease with massive ulceration.   BRIEF HISTORY: Jesse Mccoy is Mccoy 54 year old gentleman seen in the Emergency Room with exacerbation of his significant lower extremity venous occlusive disease. He has Mccoy long-standing protein S deficiency and has been managed with Xarelto, but is homeless now, currently off all his medications. He also has significant diabetes. He was admitted here in July by the internal medicine service for management of those problems, and his venous ulcerations were identified. He was treated intermittently as an outpatient, but did not follow up regularly. He was seen in the wound care center in October and referred to Dr. Juliann PulseLundquist of the general surgery service, who performed Mccoy debridement. However, the patient remained homeless and had no followup with primary care or with the wound care center. He returns with increasing drainage from his lower extremities and swelling in his calf. The surgical service was consulted.   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 He has never had Mccoy pulmonary embolus. He does not have Mccoy regular doctor at the present time as he is homeless. He also has Mccoy history of hypertension. His other major medical problems include gallbladder disease and Mccoy left wrist fracture. He was seen in the Emergency Room and medical service was consulted. They felt that the patient should be admitted to surgery as his other problems were not active at the present time.   FAMILY HISTORY: Significant for hypertension, diabetes, and coronary artery disease.  SOCIAL HISTORY: He is not Mccoy cigarette smoker. Does not drink alcohol, but is homeless. He is currently living in [facility].   REVIEW OF SYSTEMS: Otherwise  unremarkable.   PHYSICAL EXAMINATION:  GENERAL: He is an alert, very pleasant, cooperative gentleman, in no significant distress.  VITAL SIGNS: Blood pressure is 103/80. Heart rate is 94 and regular, his temperature is 98.5.  HEENT: Unremarkable. He has no scleral icterus. No pupillary abnormalities. He has no facial deformities.  NECK: Reveals no adenopathy. Midline trachea.  CHEST: Clear with no adventitious sounds. He has normal pulmonary excursion.  CARDIAC: No murmurs or gallops to my ear. He seems to be in normal sinus rhythm.  ABDOMEN: Benign with no organomegaly. No hernias. No masses noted.  EXTREMITIES: Reveals marked edema in the left leg, particularly in the calf and thigh, with large full-thickness grade 4 ulcerations in Mccoy circumferential fashion around the lower leg. He does have Mccoy good distal posterior tibial pulse and some mild foot swelling. The right lower  leg shows Mccoy vasculitic ulcer in the medial portion. He has good distal blood flow and no significant swelling.  PSYCHIATRIC: Normal orientation, normal affect.   ASSESSMENT AND PLAN: This gentleman has severe venous insufficiency, likely secondary to his untreated deep vein thrombosis problem related to his protein S deficiency.   We will plan to take him to the operating room later today, as he has recently eaten, in order to debride his leg and see if we can get control of these ulcerations. I do not think this is Mccoy situation where we can perform Mccoy wound VAC successfully, but we will see if we can get him some aggressive management in order to give him Mccoy chance to heal these ulcers. Without some improvement in  his care, he is going to lose this leg.   This plan has been discussed with the patient. He is in agreement.   ____________________________ Carmie End, MD rle:JT D: 08/09/2014 08:03:45 ET T: 08/09/2014 09:07:28 ET JOB#: [id number]  cc: Jesse Ore III, MD, <Dictator> Jesse Ore MD ELECTRONICALLY SIGNED  08/17/2014 20:10

## 2014-11-22 NOTE — Consult Note (Signed)
PATIENT NAME:  Jesse Mccoy, Jesse Mccoy MR#:  [id number] DATE OF BIRTH:  1960/08/27  DATE OF CONSULTATION:  08/09/2014  REFERRING PHYSICIAN:  Carmie End, MD  CONSULTING PHYSICIAN:  Katha Hamming, MD  PRIMARY CARE PHYSICIAN: None.   REASON FOR CONSULTATION: Medical management of diabetes, hypertension and protein S deficiency.   HISTORY OF PRESENT ILLNESS: A 54 year old male patient with history of type 2 diabetes mellitus, protein S deficiency and also has a chronic leg ulcer and chronic mastitis dermatitis, comes in because of swelling and drainage from a left leg ulcer. The patient noticed that for the past 4 days, the patient has been having foul-odor drainage from the left leg. The patient has diabetes, not taking medication for 1 month. The patient is homeless and he is in a homeless shelter, not taking medicines for diabetes and also he is not taking Xarelto for his protein S deficiency for about a month. He had a lot of pain in the left leg because of swelling and drainage. Denies any fever at home. No other complaints like chest pain or trouble breathing. The patient has a history of previous left leg ulcers and has been followed up in wound care before. The patient is supposed to go for a wound care follow up this week, but because of the leg worsening with foul-odor drainage, he came here.   PAST MEDICAL HISTORY: Significant for history of left leg ulcer, type 2 diabetes mellitus, history of DVTs and hypertension.   PAST SURGICAL HISTORY: Cholecystectomy,   SOCIAL HISTORY: The patient does not smoke or drink.   FAMILY HISTORY: Dad has heart disease. Mom has diabetes.   The patient is admitted to the surgical service for debridement of left leg ulcer. I am consulted for medical management. The patient was admitted last year in October and September as well.   MEDICATIONS: He is supposed to be on metformin 500 mg daily and he is supposed to be on Xarelto for protein S deficiency but  has not taken it for 1 month.  REVIEW OF SYSTEMS:  CONSTITUTIONAL: The patient denies any fever.  EYES: No blurred vision. No double vision. No inflammation. No glaucoma.   ENT: No tinnitus. No ear pain. No epistaxis. No difficulty swallowing.   RESPIRATIONS: No cough. No wheezing or hemoptysis.   CARDIOVASCULAR: No chest pain, no orthopnea, no PND.   GASTROINTESTINAL: No nausea. No vomiting. No diarrhea.   GENITOURINARY: No dysuria or frequency of urination.   ENDOCRINE: No polyuria or nocturia. Does have diabetes.   HEMATOLOGIC: No anemia, easy bruising. Does have a history of protein S deficiency and left leg deep vein thrombosis.   SKIN: The patient has a skin ulcer in the left leg with foul-smelling drainage.   MUSCULOSKELETAL: Has a history of arthritis.   NEUROLOGIC: No TIAs or seizures.   PSYCHIATRIC: No anxiety or insomnia.   PHYSICAL EXAMINATION: VITAL SIGNS: Temperature 98.8, heart rate 94, blood pressure 103/78, saturations is 99% on room air.   GENERAL: He is alert, awake, oriented. A well-built, well-nourished male not in distress.   HEAD: Normocephalic, atraumatic.   EYES: Pupils equal, reacting to light. Extraocular movements are intact.   ENT: No tymapanic membrane congestion.no turbinate hypertphy. . No oropharyngeal erythema.  NECK: Supple. No JVD. No thyroid enlargement. No carotid bruit. No lymphadenopathy.   LUNGS: Clear to auscultation. No wheeze. No rales. Not using accessory muscles for respiration.   CARDIOVASCULAR: S1, S2 regular. No murmurs. Does have lower extremity edema  2+ bilaterally. Carotid upstroke present. Femoral pulse present.   EXTREMITIES: The left leg has at least 3 ulcers occupying the left leg in the lower part. The patient has a massive left leg ulcers with drainage and erythema. Does have good distal pulse.   PRESENT MEDICATIONS: He is on vancomycin and Zosyn.   Dr. Michela PitcherEly is planning to do debridement. He is n.p.o. now.    ASSESSMENT AND PLAN: 1. This patient is a 54 year old homeless male with history of hypertension, protein S deficiency and diabetes and necrotic ulcers. Needs debridement, admitted to surgery. Continue vancomycin and Zosyn. The patient is n.p.o. I agree with vancomycin and Zosyn and follow cultures.  2. Diabetes mellitus type 2. He was getting metformin at home. We will start back and get a hemoglobin A1c and start sliding scale coverage as well.  3. Hypertension. The patient's blood pressure is controlled.  4. Protein S deficiency. He will be started back on Xarelto and we will follow the patient and make further recommendations.   TIME SPENT: On this consult is 55 minutes.    ____________________________ Katha HammingSnehalatha Dorn Hartshorne, MD sk:TT D: 08/09/2014 08:28:58 ET T: 08/09/2014 13:15:29 ET JOB#: [id number]  cc: Katha HammingSnehalatha Adir Schicker, MD, <Dictator> Carmie Endalph L. Ely III, MD Katha HammingSNEHALATHA Voris Tigert MD ELECTRONICALLY SIGNED 08/25/2014 13:21

## 2014-11-22 NOTE — Op Note (Signed)
PATIENT NAME:  Jesse Mccoy, Jesse Mccoy MR#:  [id number] DATE OF BIRTH:  1960-12-20  DATE OF PROCEDURE:  08/09/2014  PREOPERATIVE DIAGNOSIS: Venous stasis disease with severe ulceration, left lower extremity.   POSTOPERATIVE DIAGNOSIS: Venous stasis disease with severe ulceration, left lower extremity.   OPERATION: Debridement.   ANESTHESIA: General.   SURGEON: Quentin Orealph L. Ely III, MD   OPERATIVE PROCEDURE: With the patient in the supine position after the induction of appropriate general anesthesia, the leg was placed in Mccoy foot holder and the leg prepped with ChloraPrep and draped with sterile towels. Large ulcerations were debrided sharply using Mccoy knife down to the subcutaneous tissue. Muscle was not involved. The lesions were almost circumferential. The area was cleaned, dressed with wet-to-dry saline dressings and Mccoy compressive Ace wrap. The patient was returned to the recovery room, having tolerated the procedure well. Sponge, instrument and needle counts were correct x2 in the Operating Room.      ____________________________ Quentin Orealph L. Ely III, MD rle:lm D: 08/09/2014 11:40:19 ET T: 08/09/2014 19:10:52 ET JOB#: [id number]  cc: Quentin Orealph L. Ely III, MD, <Dictator> Quentin OreALPH L ELY MD ELECTRONICALLY SIGNED 08/17/2014 20:10

## 2019-12-23 DEATH — deceased
